# Patient Record
Sex: Female | Born: 1945 | Race: White | Hispanic: No | Smoking: Former smoker
Health system: Southern US, Community
[De-identification: ages and names within clinical notes are randomized; demographics above are authoritative.]

## PROBLEM LIST (undated history)

## (undated) DIAGNOSIS — F32A Depression, unspecified: Secondary | ICD-10-CM

## (undated) DIAGNOSIS — E785 Hyperlipidemia, unspecified: Secondary | ICD-10-CM

## (undated) DIAGNOSIS — I10 Essential (primary) hypertension: Secondary | ICD-10-CM

## (undated) DIAGNOSIS — F329 Major depressive disorder, single episode, unspecified: Secondary | ICD-10-CM

## (undated) DIAGNOSIS — R7303 Prediabetes: Secondary | ICD-10-CM

## (undated) DIAGNOSIS — N281 Cyst of kidney, acquired: Secondary | ICD-10-CM

## (undated) DIAGNOSIS — Z720 Tobacco use: Secondary | ICD-10-CM

## (undated) DIAGNOSIS — I639 Cerebral infarction, unspecified: Secondary | ICD-10-CM

## (undated) DIAGNOSIS — N3946 Mixed incontinence: Secondary | ICD-10-CM

## (undated) DIAGNOSIS — J449 Chronic obstructive pulmonary disease, unspecified: Secondary | ICD-10-CM

## (undated) DIAGNOSIS — E663 Overweight: Secondary | ICD-10-CM

## (undated) DIAGNOSIS — F419 Anxiety disorder, unspecified: Secondary | ICD-10-CM

## (undated) DIAGNOSIS — M1990 Unspecified osteoarthritis, unspecified site: Secondary | ICD-10-CM

## (undated) DIAGNOSIS — M8580 Other specified disorders of bone density and structure, unspecified site: Secondary | ICD-10-CM

## (undated) DIAGNOSIS — I4891 Unspecified atrial fibrillation: Secondary | ICD-10-CM

## (undated) HISTORY — DX: Unspecified osteoarthritis, unspecified site: M19.90

## (undated) HISTORY — DX: Essential (primary) hypertension: I10

## (undated) HISTORY — DX: Mixed incontinence: N39.46

## (undated) HISTORY — DX: Depression, unspecified: F32.A

## (undated) HISTORY — DX: Anxiety disorder, unspecified: F41.9

## (undated) HISTORY — DX: Other specified disorders of bone density and structure, unspecified site: M85.80

## (undated) HISTORY — PX: BREAST EXCISIONAL BIOPSY: SUR124

## (undated) HISTORY — DX: Overweight: E66.3

## (undated) HISTORY — DX: Cyst of kidney, acquired: N28.1

## (undated) HISTORY — DX: Tobacco use: Z72.0

## (undated) HISTORY — DX: Major depressive disorder, single episode, unspecified: F32.9

## (undated) HISTORY — DX: Prediabetes: R73.03

## (undated) HISTORY — DX: Hyperlipidemia, unspecified: E78.5

## (undated) HISTORY — DX: Chronic obstructive pulmonary disease, unspecified: J44.9

---

## 1970-09-24 HISTORY — PX: BREAST EXCISIONAL BIOPSY: SUR124

## 1970-09-24 HISTORY — PX: BREAST SURGERY: SHX581

## 1974-09-24 HISTORY — PX: VAGINAL HYSTERECTOMY: SUR661

## 2000-06-20 ENCOUNTER — Other Ambulatory Visit: Admission: RE | Admit: 2000-06-20 | Discharge: 2000-06-20 | Payer: Self-pay | Admitting: Obstetrics and Gynecology

## 2000-12-26 ENCOUNTER — Encounter: Admission: RE | Admit: 2000-12-26 | Discharge: 2000-12-26 | Payer: Self-pay | Admitting: Family Medicine

## 2000-12-26 ENCOUNTER — Encounter: Payer: Self-pay | Admitting: Family Medicine

## 2002-01-02 ENCOUNTER — Encounter: Payer: Self-pay | Admitting: Family Medicine

## 2002-01-02 ENCOUNTER — Encounter: Admission: RE | Admit: 2002-01-02 | Discharge: 2002-01-02 | Payer: Self-pay | Admitting: Family Medicine

## 2002-12-24 HISTORY — PX: BUNIONECTOMY: SHX129

## 2002-12-25 ENCOUNTER — Other Ambulatory Visit: Admission: RE | Admit: 2002-12-25 | Discharge: 2002-12-25 | Payer: Self-pay | Admitting: Obstetrics and Gynecology

## 2002-12-25 LAB — HM PAP SMEAR

## 2004-03-02 ENCOUNTER — Encounter: Admission: RE | Admit: 2004-03-02 | Discharge: 2004-05-31 | Payer: Self-pay | Admitting: Family Medicine

## 2006-05-03 ENCOUNTER — Encounter: Admission: RE | Admit: 2006-05-03 | Discharge: 2006-05-03 | Payer: Self-pay | Admitting: Obstetrics and Gynecology

## 2006-05-03 LAB — HM DEXA SCAN: HM Dexa Scan: NORMAL

## 2007-05-07 ENCOUNTER — Encounter: Admission: RE | Admit: 2007-05-07 | Discharge: 2007-05-07 | Payer: Self-pay | Admitting: Family Medicine

## 2008-05-17 ENCOUNTER — Encounter: Admission: RE | Admit: 2008-05-17 | Discharge: 2008-05-17 | Payer: Self-pay | Admitting: Family Medicine

## 2008-09-24 DIAGNOSIS — N3946 Mixed incontinence: Secondary | ICD-10-CM

## 2008-09-24 HISTORY — DX: Mixed incontinence: N39.46

## 2009-05-18 ENCOUNTER — Encounter: Admission: RE | Admit: 2009-05-18 | Discharge: 2009-05-18 | Payer: Self-pay | Admitting: Obstetrics and Gynecology

## 2009-05-25 ENCOUNTER — Encounter: Admission: RE | Admit: 2009-05-25 | Discharge: 2009-05-25 | Payer: Self-pay | Admitting: Obstetrics and Gynecology

## 2010-05-26 ENCOUNTER — Encounter: Admission: RE | Admit: 2010-05-26 | Discharge: 2010-05-26 | Payer: Self-pay | Admitting: Obstetrics and Gynecology

## 2011-05-08 ENCOUNTER — Other Ambulatory Visit: Payer: Self-pay | Admitting: Family Medicine

## 2011-05-08 DIAGNOSIS — Z1231 Encounter for screening mammogram for malignant neoplasm of breast: Secondary | ICD-10-CM

## 2011-06-13 ENCOUNTER — Ambulatory Visit
Admission: RE | Admit: 2011-06-13 | Discharge: 2011-06-13 | Disposition: A | Discharge status: Home / Self Care | Payer: BC Managed Care – PPO | Source: Ambulatory Visit | Attending: Family Medicine | Admitting: Family Medicine

## 2011-06-13 DIAGNOSIS — Z1231 Encounter for screening mammogram for malignant neoplasm of breast: Secondary | ICD-10-CM

## 2011-11-12 DIAGNOSIS — H04129 Dry eye syndrome of unspecified lacrimal gland: Secondary | ICD-10-CM | POA: Diagnosis not present

## 2011-11-12 DIAGNOSIS — H2510 Age-related nuclear cataract, unspecified eye: Secondary | ICD-10-CM | POA: Diagnosis not present

## 2011-11-12 DIAGNOSIS — H01009 Unspecified blepharitis unspecified eye, unspecified eyelid: Secondary | ICD-10-CM | POA: Diagnosis not present

## 2011-11-12 DIAGNOSIS — H5034 Intermittent alternating exotropia: Secondary | ICD-10-CM | POA: Diagnosis not present

## 2012-03-11 DIAGNOSIS — R7309 Other abnormal glucose: Secondary | ICD-10-CM | POA: Diagnosis not present

## 2012-03-11 DIAGNOSIS — E7800 Pure hypercholesterolemia, unspecified: Secondary | ICD-10-CM | POA: Diagnosis not present

## 2012-03-11 DIAGNOSIS — F17200 Nicotine dependence, unspecified, uncomplicated: Secondary | ICD-10-CM | POA: Diagnosis not present

## 2012-03-11 DIAGNOSIS — Z23 Encounter for immunization: Secondary | ICD-10-CM | POA: Diagnosis not present

## 2012-05-23 ENCOUNTER — Other Ambulatory Visit: Payer: Self-pay | Admitting: Family Medicine

## 2012-05-23 DIAGNOSIS — Z1231 Encounter for screening mammogram for malignant neoplasm of breast: Secondary | ICD-10-CM

## 2012-06-16 ENCOUNTER — Other Ambulatory Visit: Payer: Self-pay | Admitting: Obstetrics and Gynecology

## 2012-06-16 ENCOUNTER — Ambulatory Visit
Admission: RE | Admit: 2012-06-16 | Discharge: 2012-06-16 | Disposition: A | Discharge status: Home / Self Care | Payer: Medicare Other | Source: Ambulatory Visit | Attending: Family Medicine | Admitting: Family Medicine

## 2012-06-16 DIAGNOSIS — Z1231 Encounter for screening mammogram for malignant neoplasm of breast: Secondary | ICD-10-CM | POA: Diagnosis not present

## 2012-06-16 LAB — HM MAMMOGRAPHY

## 2012-09-19 DIAGNOSIS — E785 Hyperlipidemia, unspecified: Secondary | ICD-10-CM | POA: Diagnosis not present

## 2012-09-19 DIAGNOSIS — E7800 Pure hypercholesterolemia, unspecified: Secondary | ICD-10-CM | POA: Diagnosis not present

## 2012-11-17 DIAGNOSIS — H2510 Age-related nuclear cataract, unspecified eye: Secondary | ICD-10-CM | POA: Diagnosis not present

## 2013-01-06 ENCOUNTER — Encounter: Payer: Self-pay | Admitting: *Deleted

## 2013-01-07 ENCOUNTER — Encounter: Payer: Self-pay | Admitting: Obstetrics and Gynecology

## 2013-01-07 ENCOUNTER — Ambulatory Visit (INDEPENDENT_AMBULATORY_CARE_PROVIDER_SITE_OTHER): Payer: Medicare Other | Admitting: Obstetrics and Gynecology

## 2013-01-07 VITALS — BP 122/80 | Ht 63.5 in | Wt 158.0 lb

## 2013-01-07 DIAGNOSIS — Z124 Encounter for screening for malignant neoplasm of cervix: Secondary | ICD-10-CM

## 2013-01-07 DIAGNOSIS — Z01419 Encounter for gynecological examination (general) (routine) without abnormal findings: Secondary | ICD-10-CM | POA: Diagnosis not present

## 2013-01-07 DIAGNOSIS — N3946 Mixed incontinence: Secondary | ICD-10-CM | POA: Diagnosis not present

## 2013-01-07 MED ORDER — DARIFENACIN HYDROBROMIDE ER 15 MG PO TB24: 15.0000 mg | ORAL_TABLET | Freq: Every day | ORAL | Status: DC

## 2013-01-07 NOTE — Patient Instructions (Addendum)
EXERCISE AND DIET:  We recommended that you start or continue a regular exercise program for good health. Regular exercise means any activity that makes your heart beat faster and makes you sweat.  We recommend exercising at least 30 minutes per day at least 3 days a week, preferably 4 or 5.  We also recommend a diet low in fat and sugar.  Inactivity, poor dietary choices and obesity can cause diabetes, heart attack, stroke, and kidney damage, among others.    ALCOHOL AND SMOKING:  Women should limit their alcohol intake to no more than 7 drinks/beers/glasses of wine (combined, not each!) per week. Moderation of alcohol intake to this level decreases your risk of breast cancer and liver damage. And of course, no recreational drugs are part of a healthy lifestyle.  And absolutely no smoking or even second hand smoke. Most people know smoking can cause heart and lung diseases, but did you know it also contributes to weakening of your bones? Aging of your skin?  Yellowing of your teeth and nails?  CALCIUM AND VITAMIN D:  Adequate intake of calcium and Vitamin D are recommended.  The recommendations for exact amounts of these supplements seem to change often, but generally speaking 600 mg of calcium (either carbonate or citrate) and 800 units of Vitamin D per day seems prudent. Certain women may benefit from higher intake of Vitamin D.  If you are among these women, your doctor will have told you during your visit.    PAP SMEARS:  Pap smears, to check for cervical cancer or precancers,  have traditionally been done yearly, although recent scientific advances have shown that most women can have pap smears less often.  However, every woman still should have a physical exam from her gynecologist every 66 years. It will include a breast check, inspection of the vulva and vagina to check for abnormal growths or skin changes, a visual exam of the cervix, and then an exam to evaluate the size and shape of the uterus and  ovaries.  And after 66 years of age, a rectal exam is indicated to check for rectal cancers. We will also provide age appropriate advice regarding health maintenance, like when you should have certain vaccines, screening for sexually transmitted diseases, bone density testing, colonoscopy, mammograms, etc.   MAMMOGRAMS:  All women over 66 years old should have a yearly mammogram. Many facilities now offer a "3D" mammogram, which may cost around $50 extra out of pocket. If possible,  we recommend you accept the option to have the 3D mammogram performed.  It both reduces the number of women who will be called back for extra views which then turn out to be normal, and it is better than the routine mammogram at detecting truly abnormal areas.    COLONOSCOPY:  Colonoscopy to screen for colon cancer is recommended for all women at age 66.  We know, you hate the idea of the prep.  We agree, BUT, having colon cancer and not knowing it is worse!!  Colon cancer so often starts as a polyp that can be seen and removed at colonscopy, which can quite literally save your life!  And if your first colonoscopy is normal and you have no family history of colon cancer, most women don't have to have it again for 10 years.  Once every ten years, you can do something that may end up saving your life, right?  We will be happy to help you get it scheduled when you are ready.    Be sure to check your insurance coverage so you understand how much it will cost.  It may be covered as a preventative service at no cost, but you should check your particular policy.     Give your new bladder medicine about a month to see how it will work for you.  Call me if you feel it is not helping after that time.  STOP SMOKING!!!  It is making your bones crumble!!

## 2013-01-07 NOTE — Progress Notes (Signed)
67 y.o.  Married  Caucasian female   G2P2 here for annual exam. Also complains that incontinence is worsening, esp SUI but also urge incontinence.  Nocturia times 2-3.  Pt wants to try something.  She does already have dry eye and has to use drops for that.  Prev used Detrol LA and had trouble sleeping.    Encourage pt to quit smoking.  Tried e cig, but still smoking 1 ppd.     Patient's last menstrual period was 09/24/1974.          Sexually active: yes  The current method of family planning is status post hysterectomy and post menopausal status.    Exercising: walking 3 days a week for 30 minutes Last mammogram:  06/16/12 benign Last pap smear:12/25/02 WNL History of abnormal pap: no Smoking: cigarette smoker average 1 pack a day Alcohol: 2-3 glasses of wine a week Last colonoscopy: 06/30/06 normal repeat in 10 years Last Bone Density:  05/12/06 normal Last tetanus shot: not sure Last cholesterol check: 08/2012 elevated  Hgb:  pcp              Urine: pcp    Health Maintenance  Topic Date Due  . Tetanus/tdap  10/21/1965  . Zostavax  10/21/2006  . Pneumococcal Polysaccharide Vaccine Age 3 And Over  10/22/2011  . Influenza Vaccine  05/25/2013  . Mammogram  06/16/2014  . Colonoscopy  06/30/2016    Family History  Problem Relation Age of Onset  . Heart disease Mother   . Dementia Mother   . Diabetes Father   . Heart attack Father     There is no problem list on file for this patient.   Past Medical History  Diagnosis Date  . Mixed incontinence 2010    Past Surgical History  Procedure Laterality Date  . Bunionectomy  12/2002  . Vaginal hysterectomy  1976  . Breast surgery  1972    benign tumor removed rt breast    Allergies: Lamisil  Current Outpatient Prescriptions  Medication Sig Dispense Refill  . aspirin 81 MG tablet Take 81 mg by mouth daily.      . Calcium Carbonate-Vitamin D (CALCIUM 600/VITAMIN D PO) Take 2 tablets by mouth daily.      . Multiple  Vitamins-Minerals (CENTRUM SILVER PO) Take by mouth daily.      . rosuvastatin (CRESTOR) 40 MG tablet Take 40 mg by mouth daily.      . vitamin E (VITAMIN E) 400 UNIT capsule Take 400 Units by mouth daily.       No current facility-administered medications for this visit.    ROS: Pertinent items are noted in HPI.  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  Husband with COPD from smoking.  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    Exam:    BP 122/80  Ht 5' 3.5" (1.613 m)  Wt 158 lb (71.668 kg)  BMI 27.55 kg/m2  LMP 09/24/1974  Down one pound from last year Wt Readings from Last 3 Encounters:  01/07/13 158 lb (71.668 kg)     Ht Readings from Last 3 Encounters:  01/07/13 5' 3.5" (1.613 m)  height stable from last year  General appearance: alert, cooperative and appears stated age Head: Normocephalic, without obvious abnormality, atraumatic Neck: no adenopathy, supple, symmetrical, trachea midline and thyroid not enlarged, symmetric, no  tenderness/mass/nodules Lungs: clear to auscultation bilaterally  Breasts: Inspection negative, No nipple retraction or dimpling, No nipple discharge or bleeding, No axillary or supraclavicular adenopathy, Normal to palpation without dominant masses Heart: regular rate and rhythm Abdomen: soft, non-tender; bowel sounds normal; no masses,  no organomegaly Extremities: extremities normal, atraumatic, no cyanosis or edema Skin: Skin color, texture, turgor normal. No rashes or lesions Lymph nodes: Cervical, supraclavicular, and axillary nodes normal. No abnormal inguinal nodes palpated Neurologic: Grossly normal   Pelvic: External genitalia:  no lesions              Urethra:  normal appearing urethra with no masses, tenderness or lesions              Bartholins and Skenes: normal                 Vagina: normal  appearing vagina with normal color and discharge, no lesions, grade 2 cystocele              Cervix: surgically absent              Pap taken: no        Bimanual Exam:  No pelvic masses or nodularity.  Adnexa: normal adnexa in size, nontender and no masses                                      Rectovaginal: Confirms                                      Anus:  normal sphincter tone, no lesions  A: normal menopausal exam, no HRT; quit in Sept 2011 to decrease the # of meds she took.       S/p TVH for DUB in 1976     Grade 2 cystocele/urethrocele with mixed incontinence.  Prev used Detrol LA but "couldn't sleep" Ready now to try something else since problem is worsening.      P: mammogram counseled on breast self exam, mammography screening, adequate intake of calcium and vitamin D, diet and exercise return annually or prn Instructed in enablex 15 mg po qd #30. 12 rf.  Get BMD this year with next MM in the fall. Discussed smoking's effect on BMD.      An After Visit Summary was printed and given to the patient.

## 2013-01-12 ENCOUNTER — Telehealth: Payer: Self-pay | Admitting: *Deleted

## 2013-01-12 MED ORDER — SOLIFENACIN SUCCINATE 5 MG PO TABS: 5.0000 mg | ORAL_TABLET | Freq: Every day | ORAL | Status: DC

## 2013-01-12 NOTE — Telephone Encounter (Signed)
Please let the patient know that we will need to select a medication which is on the preferred drug list.  If she has not tried Vesicare, I recommend this option. Rx Vesicare 5 mg daily.  Dispense #30.  Refill 2  Needs recheck with Dr. Tresa Res in 6 weeks to see response.  Thanks,  [employer]

## 2013-01-12 NOTE — Telephone Encounter (Signed)
Please advise on medication for Dr. Tresa Res patient. Chart on your desk. Will [facility].

## 2013-01-12 NOTE — Telephone Encounter (Signed)
Patient notifed of medication change to Vesicare. [facility] to [facility]. Patient aware need to make 6 week appt. With Dr. Tresa Res for follow up. sue

## 2013-01-22 ENCOUNTER — Telehealth: Payer: Self-pay | Admitting: *Deleted

## 2013-01-22 NOTE — Telephone Encounter (Signed)
SEE CHART IN YOUR CABINET FOR PRIOR AUTH. FORM TO FILLED OUT FOR MEDICATION ENABLEX.

## 2013-01-23 ENCOUNTER — Telehealth: Payer: Self-pay | Admitting: *Deleted

## 2013-01-23 NOTE — Telephone Encounter (Signed)
Faxed prior authorization form to [facility] for Enablex, sue

## 2013-01-26 ENCOUNTER — Other Ambulatory Visit: Payer: Self-pay | Admitting: *Deleted

## 2013-01-26 NOTE — Telephone Encounter (Signed)
Prior authorization approved for Enablex.

## 2013-02-02 ENCOUNTER — Telehealth: Payer: Self-pay | Admitting: Obstetrics and Gynecology

## 2013-02-02 MED ORDER — SOLIFENACIN SUCCINATE 5 MG PO TABS: 5.0000 mg | ORAL_TABLET | Freq: Every day | ORAL | Status: DC

## 2013-02-02 NOTE — Telephone Encounter (Signed)
Pt states Vesicare is working well.  She is not having any side effects.  Pt was changed from [facility] on 4/21 due to insurance.  Pt would like to have 90 day supply sent to mail order.  Pt's last AEX was 01/07/13.  Is this refill OK or does the pt need an OV with you first?  Chart on your desk.  Please advise.

## 2013-02-02 NOTE — Telephone Encounter (Signed)
Enablex not covered by insurance. Vesicare rx was given with two refills and reports it is "working great". Requests rx for mail order be sent [facility] at [phone].

## 2013-02-02 NOTE — Telephone Encounter (Signed)
Verbal per Dr. Tresa Res, OK to fill 90 day supply through 01/07/14.  RX sent.  No answer at home number x 8 rings.

## 2013-03-11 DIAGNOSIS — E7800 Pure hypercholesterolemia, unspecified: Secondary | ICD-10-CM | POA: Diagnosis not present

## 2013-03-11 DIAGNOSIS — F17200 Nicotine dependence, unspecified, uncomplicated: Secondary | ICD-10-CM | POA: Diagnosis not present

## 2013-03-11 DIAGNOSIS — R7301 Impaired fasting glucose: Secondary | ICD-10-CM | POA: Diagnosis not present

## 2013-03-11 DIAGNOSIS — I10 Essential (primary) hypertension: Secondary | ICD-10-CM | POA: Diagnosis not present

## 2013-03-11 DIAGNOSIS — Z1331 Encounter for screening for depression: Secondary | ICD-10-CM | POA: Diagnosis not present

## 2013-04-22 ENCOUNTER — Telehealth: Payer: Self-pay | Admitting: Obstetrics and Gynecology

## 2013-04-22 NOTE — Telephone Encounter (Signed)
[facility],   Patient is asking for a new presciption for Vesicare. Patient says she was given this prescription from the on call doctor while Dr.Romine was on vacation.

## 2013-04-23 NOTE — Telephone Encounter (Signed)
Left a voice message telling patient that a 90 day supply with refills thru 01/07/14 for Vesicare was Send to [facility] 02/02/13. Please call [facility] to check on refills.

## 2013-04-28 ENCOUNTER — Telehealth: Payer: Self-pay | Admitting: *Deleted

## 2013-04-28 DIAGNOSIS — N3946 Mixed incontinence: Secondary | ICD-10-CM

## 2013-04-28 MED ORDER — DARIFENACIN HYDROBROMIDE ER 15 MG PO TB24: 15.0000 mg | ORAL_TABLET | Freq: Every day | ORAL | Status: DC

## 2013-04-28 NOTE — Telephone Encounter (Signed)
Fax from [facility] for new prescription request of enablex. Last Aex 01/07/13; #30/12 was sent to patient's local [facility] [address].  Enablex #90/2 rf's sent to [facility] to last patient until AEX.

## 2013-05-27 ENCOUNTER — Telehealth: Payer: Self-pay | Admitting: Gynecology

## 2013-05-27 NOTE — Telephone Encounter (Incomplete)
Patient needs to have a order sent to [facility] for her mammogram and her bone density.

## 2013-05-27 NOTE — Telephone Encounter (Signed)
Spoke with pt to advise Dr. Farrel Gobble will review chart and [facility]. Advised pt it would be best to wait until at least 06-16-13 for MMG to make sure it has been a year since her last one. Pt will call in a few days for appt.

## 2013-05-27 NOTE — Telephone Encounter (Signed)
Per paper chart, last MMG 06-16-12, normal. Last BMD 04-2006, normal. Both done at [facility]. Paper chart on your desk.

## 2013-06-01 ENCOUNTER — Telehealth: Payer: Self-pay | Admitting: *Deleted

## 2013-06-01 ENCOUNTER — Other Ambulatory Visit: Payer: Self-pay | Admitting: Gynecology

## 2013-06-01 NOTE — Telephone Encounter (Signed)
Spoke with patient explained to her that Medicare will not cover Bone Density(screening) the last BMD she had was in 2007 it was normal, we have tried every Cpt code we could think of  unless you have a estrogen deficiency, at risk for osteroporosis and a couple others which you don't have. This is not going to be a covered service. Pt was ok with not having a BMD test done, will just have her mammogram for this year.

## 2013-06-02 ENCOUNTER — Other Ambulatory Visit: Payer: Self-pay

## 2013-06-02 DIAGNOSIS — Z1231 Encounter for screening mammogram for malignant neoplasm of breast: Secondary | ICD-10-CM

## 2013-06-16 DIAGNOSIS — Z23 Encounter for immunization: Secondary | ICD-10-CM | POA: Diagnosis not present

## 2013-06-23 ENCOUNTER — Ambulatory Visit
Admission: RE | Admit: 2013-06-23 | Discharge: 2013-06-23 | Disposition: A | Discharge status: Home / Self Care | Payer: Medicare Other | Source: Ambulatory Visit

## 2013-06-23 DIAGNOSIS — Z1231 Encounter for screening mammogram for malignant neoplasm of breast: Secondary | ICD-10-CM

## 2013-08-24 ENCOUNTER — Ambulatory Visit
Admission: RE | Admit: 2013-08-24 | Discharge: 2013-08-24 | Disposition: A | Discharge status: Home / Self Care | Payer: Medicare Other | Source: Ambulatory Visit | Attending: Family Medicine | Admitting: Family Medicine

## 2013-08-24 ENCOUNTER — Other Ambulatory Visit: Payer: Self-pay | Admitting: Family Medicine

## 2013-08-24 DIAGNOSIS — M79644 Pain in right finger(s): Secondary | ICD-10-CM

## 2013-08-24 DIAGNOSIS — F17200 Nicotine dependence, unspecified, uncomplicated: Secondary | ICD-10-CM | POA: Diagnosis not present

## 2013-08-24 DIAGNOSIS — R079 Chest pain, unspecified: Secondary | ICD-10-CM

## 2013-08-24 DIAGNOSIS — S63259A Unspecified dislocation of unspecified finger, initial encounter: Secondary | ICD-10-CM | POA: Diagnosis not present

## 2013-08-24 DIAGNOSIS — I10 Essential (primary) hypertension: Secondary | ICD-10-CM | POA: Diagnosis not present

## 2013-08-24 DIAGNOSIS — M79609 Pain in unspecified limb: Secondary | ICD-10-CM | POA: Diagnosis not present

## 2013-08-24 DIAGNOSIS — M79645 Pain in left finger(s): Secondary | ICD-10-CM

## 2013-09-08 DIAGNOSIS — M6281 Muscle weakness (generalized): Secondary | ICD-10-CM | POA: Diagnosis not present

## 2013-09-08 DIAGNOSIS — M79609 Pain in unspecified limb: Secondary | ICD-10-CM | POA: Diagnosis not present

## 2013-09-10 DIAGNOSIS — M6281 Muscle weakness (generalized): Secondary | ICD-10-CM | POA: Diagnosis not present

## 2013-09-10 DIAGNOSIS — M79609 Pain in unspecified limb: Secondary | ICD-10-CM | POA: Diagnosis not present

## 2013-09-30 DIAGNOSIS — M19049 Primary osteoarthritis, unspecified hand: Secondary | ICD-10-CM | POA: Diagnosis not present

## 2013-10-23 DIAGNOSIS — M19049 Primary osteoarthritis, unspecified hand: Secondary | ICD-10-CM | POA: Diagnosis not present

## 2013-11-04 DIAGNOSIS — M19049 Primary osteoarthritis, unspecified hand: Secondary | ICD-10-CM | POA: Diagnosis not present

## 2013-11-04 DIAGNOSIS — G5600 Carpal tunnel syndrome, unspecified upper limb: Secondary | ICD-10-CM | POA: Diagnosis not present

## 2013-12-15 ENCOUNTER — Encounter: Payer: Self-pay | Admitting: Gynecology

## 2014-01-06 ENCOUNTER — Ambulatory Visit: Payer: Medicare Other | Admitting: Gynecology

## 2014-01-11 ENCOUNTER — Ambulatory Visit: Payer: Medicare Other | Admitting: Obstetrics and Gynecology

## 2014-01-11 ENCOUNTER — Ambulatory Visit: Payer: Medicare Other | Admitting: Gynecology

## 2014-01-11 DIAGNOSIS — G5600 Carpal tunnel syndrome, unspecified upper limb: Secondary | ICD-10-CM | POA: Diagnosis not present

## 2014-01-12 DIAGNOSIS — H2510 Age-related nuclear cataract, unspecified eye: Secondary | ICD-10-CM | POA: Diagnosis not present

## 2014-01-12 DIAGNOSIS — H01019 Ulcerative blepharitis unspecified eye, unspecified eyelid: Secondary | ICD-10-CM | POA: Diagnosis not present

## 2014-01-13 ENCOUNTER — Encounter: Payer: Self-pay | Admitting: Gynecology

## 2014-01-13 ENCOUNTER — Ambulatory Visit (INDEPENDENT_AMBULATORY_CARE_PROVIDER_SITE_OTHER): Payer: Medicare Other | Admitting: Gynecology

## 2014-01-13 DIAGNOSIS — M40204 Unspecified kyphosis, thoracic region: Secondary | ICD-10-CM | POA: Insufficient documentation

## 2014-01-13 DIAGNOSIS — Z124 Encounter for screening for malignant neoplasm of cervix: Secondary | ICD-10-CM

## 2014-01-13 DIAGNOSIS — F17200 Nicotine dependence, unspecified, uncomplicated: Secondary | ICD-10-CM | POA: Insufficient documentation

## 2014-01-13 DIAGNOSIS — Z01419 Encounter for gynecological examination (general) (routine) without abnormal findings: Secondary | ICD-10-CM | POA: Diagnosis not present

## 2014-01-13 DIAGNOSIS — E785 Hyperlipidemia, unspecified: Secondary | ICD-10-CM | POA: Insufficient documentation

## 2014-01-13 DIAGNOSIS — M4000 Postural kyphosis, site unspecified: Secondary | ICD-10-CM | POA: Diagnosis not present

## 2014-01-13 DIAGNOSIS — N3946 Mixed incontinence: Secondary | ICD-10-CM

## 2014-01-13 DIAGNOSIS — E2839 Other primary ovarian failure: Secondary | ICD-10-CM

## 2014-01-13 DIAGNOSIS — M1990 Unspecified osteoarthritis, unspecified site: Secondary | ICD-10-CM | POA: Insufficient documentation

## 2014-01-13 MED ORDER — SOLIFENACIN SUCCINATE 5 MG PO TABS: 5.0000 mg | ORAL_TABLET | Freq: Every day | ORAL | Status: DC

## 2014-01-13 NOTE — Progress Notes (Signed)
68 y.o. married Caucasian female   G2P2 here for annual exam. Pt reports menses are not regular, secondary TVH  She does not report hot flashes, does not have night sweats, does not have vaginal dryness.  She is not using lubricants.  She does not report post-menopasual bleeding.  Pt is now using the vesicare due to lower cost, she is not having any issues with dry eye. Pt did not get DEXA last year.  Pt is off estrogen for 5y.  Pt is still smoking 1ppd for 40y, is not interested in quitting.  Pt reports grandson died September 17, 2103 in car accident at 67yo, they will be celebrating his [place], family is adjusting slowly.    Patient's last menstrual period was 09/24/1974.          Sexually active: yes  The current method of family planning is status post hysterectomy.    Exercising: yes  walking a couple of times per week Last pap: 12/25/02-WNL Abnormal PAP: none Mammogram: 06/23/13-normal BSE: yes Colonoscopy: 2008-repeat in 10 years DEXA: 8/07-normal-will need order Alcohol: 3-4 glasses of wine/week Tobacco: one PPD  Health Maintenance  Topic Date Due  . Tetanus/tdap  10/21/1965  . Zostavax  10/21/2006  . Pneumococcal Polysaccharide Vaccine Age 98 And Over  10/22/2011  . Influenza Vaccine  04/24/2014  . Mammogram  06/24/2015  . Colonoscopy  06/30/2016    Family History  Problem Relation Age of Onset  . Heart disease Mother   . Dementia Mother   . Diabetes Father   . Heart attack Father     There are no active problems to display for this patient.   Past Medical History  Diagnosis Date  . Mixed incontinence 2010    Past Surgical History  Procedure Laterality Date  . Bunionectomy  12/2002  . Vaginal hysterectomy  1976  . Breast surgery  1972    benign tumor removed rt breast    Allergies: Lamisil  Current Outpatient Prescriptions  Medication Sig Dispense Refill  . aspirin 81 MG tablet Take 81 mg by mouth daily.      . Calcium Carbonate-Vitamin D  (CALCIUM 600/VITAMIN D PO) Take 2 tablets by mouth daily.      Marland Kitchen darifenacin (ENABLEX) 15 MG 24 hr tablet Take 1 tablet (15 mg total) by mouth daily.  90 tablet  2  . Multiple Vitamins-Minerals (CENTRUM SILVER PO) Take by mouth daily.      . rosuvastatin (CRESTOR) 40 MG tablet Take 40 mg by mouth daily.      . solifenacin (VESICARE) 5 MG tablet Take 1 tablet (5 mg total) by mouth daily.  90 tablet  3  . vitamin E (VITAMIN E) 400 UNIT capsule Take 400 Units by mouth daily.       No current facility-administered medications for this visit.    ROS: Pertinent items are noted in HPI.  Exam:    LMP 09/24/1974 Weight change: @WEIGHTCHANGE @ Last 3 height recordings:  Ht Readings from Last 3 Encounters:  01/07/13 5' 3.5" (1.613 m)   General appearance: alert, cooperative and appears stated age Head: Normocephalic, without obvious abnormality, atraumatic Neck: no adenopathy, no carotid bruit, no JVD, supple, symmetrical, trachea midline and thyroid not enlarged, symmetric, no tenderness/mass/nodules Lungs: clear to auscultation bilaterally Breasts: normal appearance, no masses or tenderness Heart: regular rate and rhythm, S1, S2 normal, no murmur, click, rub or gallop Abdomen: soft, non-tender; bowel sounds normal; no masses,  no organomegaly Extremities: extremities normal, atraumatic,  no cyanosis or edema Skin: Skin color, texture, turgor normal. No rashes or lesions Lymph nodes: Cervical, supraclavicular, and axillary nodes normal. no inguinal nodes palpated Neurologic: Grossly normal   Pelvic: External genitalia:  no lesions              Urethra: normal appearing urethra with no masses, tenderness or lesions              Bartholins and Skenes: Bartholin's, Urethra, Skene's normal                 Vagina: atrophic, supported, cystocele, rectocele              Cervix: absent              Pap taken: no        Bimanual Exam:  Uterus:  absent                                      Adnexa:     no masses                                      Rectovaginal: Confirms                                      Anus:  normal sphincter tone, no lesions  1. Encounter for routine gynecological examination  well woman Discussed PAP guideline changes, importance of weight bearing exercises, calcium, vit D and balanced diet.  2. Mixed incontinence urge and stress Tolerating veiscare will refill - solifenacin (VESICARE) 5 MG tablet; Take 1 tablet (5 mg total) by mouth daily.  Dispense: 90 tablet; Refill: 3  3. Smoker Not interested in quitting, pt aware of affects on BMD, general health - DG Bone Density; Future  4. Kyphosis of thoracic region  - DG Bone Density; Future  5. Hypoestrogenism  - DG Bone Density; Future  Condolences given regarding sudden death of grandson, pt with good social support   An After Visit Summary was printed and given to the patient.

## 2014-01-20 ENCOUNTER — Ambulatory Visit
Admission: RE | Admit: 2014-01-20 | Discharge: 2014-01-20 | Disposition: A | Discharge status: Home / Self Care | Payer: Medicare Other | Source: Ambulatory Visit | Attending: Gynecology | Admitting: Gynecology

## 2014-01-20 DIAGNOSIS — M40204 Unspecified kyphosis, thoracic region: Secondary | ICD-10-CM

## 2014-01-20 DIAGNOSIS — F17200 Nicotine dependence, unspecified, uncomplicated: Secondary | ICD-10-CM

## 2014-01-20 DIAGNOSIS — Z780 Asymptomatic menopausal state: Secondary | ICD-10-CM | POA: Diagnosis not present

## 2014-01-20 DIAGNOSIS — E2839 Other primary ovarian failure: Secondary | ICD-10-CM

## 2014-02-23 DIAGNOSIS — H01019 Ulcerative blepharitis unspecified eye, unspecified eyelid: Secondary | ICD-10-CM | POA: Diagnosis not present

## 2014-06-03 ENCOUNTER — Other Ambulatory Visit: Payer: Self-pay

## 2014-06-03 DIAGNOSIS — Z1231 Encounter for screening mammogram for malignant neoplasm of breast: Secondary | ICD-10-CM

## 2014-06-29 ENCOUNTER — Ambulatory Visit
Admission: RE | Admit: 2014-06-29 | Discharge: 2014-06-29 | Disposition: A | Discharge status: Home / Self Care | Payer: Medicare Other | Source: Ambulatory Visit

## 2014-06-29 DIAGNOSIS — Z1231 Encounter for screening mammogram for malignant neoplasm of breast: Secondary | ICD-10-CM

## 2014-07-26 ENCOUNTER — Encounter: Payer: Self-pay | Admitting: Gynecology

## 2014-08-04 DIAGNOSIS — E780 Pure hypercholesterolemia: Secondary | ICD-10-CM | POA: Diagnosis not present

## 2014-08-04 DIAGNOSIS — R030 Elevated blood-pressure reading, without diagnosis of hypertension: Secondary | ICD-10-CM | POA: Diagnosis not present

## 2014-08-04 DIAGNOSIS — F17210 Nicotine dependence, cigarettes, uncomplicated: Secondary | ICD-10-CM | POA: Diagnosis not present

## 2014-08-04 DIAGNOSIS — Z23 Encounter for immunization: Secondary | ICD-10-CM | POA: Diagnosis not present

## 2014-08-04 DIAGNOSIS — F17200 Nicotine dependence, unspecified, uncomplicated: Secondary | ICD-10-CM | POA: Diagnosis not present

## 2014-08-04 DIAGNOSIS — R739 Hyperglycemia, unspecified: Secondary | ICD-10-CM | POA: Diagnosis not present

## 2014-08-09 ENCOUNTER — Other Ambulatory Visit: Payer: Self-pay | Admitting: Family Medicine

## 2014-08-09 DIAGNOSIS — F17290 Nicotine dependence, other tobacco product, uncomplicated: Secondary | ICD-10-CM

## 2014-08-13 ENCOUNTER — Ambulatory Visit
Admission: RE | Admit: 2014-08-13 | Discharge: 2014-08-13 | Disposition: A | Discharge status: Home / Self Care | Payer: Medicare Other | Source: Ambulatory Visit | Attending: Family Medicine | Admitting: Family Medicine

## 2014-08-13 DIAGNOSIS — J432 Centrilobular emphysema: Secondary | ICD-10-CM | POA: Diagnosis not present

## 2014-08-13 DIAGNOSIS — J9809 Other diseases of bronchus, not elsewhere classified: Secondary | ICD-10-CM | POA: Diagnosis not present

## 2014-08-13 DIAGNOSIS — I2510 Atherosclerotic heart disease of native coronary artery without angina pectoris: Secondary | ICD-10-CM | POA: Diagnosis not present

## 2014-08-13 DIAGNOSIS — F17210 Nicotine dependence, cigarettes, uncomplicated: Secondary | ICD-10-CM | POA: Diagnosis not present

## 2014-08-13 DIAGNOSIS — F17290 Nicotine dependence, other tobacco product, uncomplicated: Secondary | ICD-10-CM

## 2014-08-13 DIAGNOSIS — Z122 Encounter for screening for malignant neoplasm of respiratory organs: Secondary | ICD-10-CM | POA: Diagnosis not present

## 2014-08-13 DIAGNOSIS — Z87891 Personal history of nicotine dependence: Secondary | ICD-10-CM | POA: Diagnosis not present

## 2014-08-24 ENCOUNTER — Telehealth: Payer: Self-pay

## 2014-08-24 NOTE — Telephone Encounter (Signed)
AEX has been rescheduled with Melvia Heaps on 01/17/15

## 2014-08-26 ENCOUNTER — Other Ambulatory Visit: Payer: Self-pay | Admitting: Family Medicine

## 2014-08-26 DIAGNOSIS — H04123 Dry eye syndrome of bilateral lacrimal glands: Secondary | ICD-10-CM | POA: Diagnosis not present

## 2014-08-26 DIAGNOSIS — H01024 Squamous blepharitis left upper eyelid: Secondary | ICD-10-CM | POA: Diagnosis not present

## 2014-08-26 DIAGNOSIS — H01021 Squamous blepharitis right upper eyelid: Secondary | ICD-10-CM | POA: Diagnosis not present

## 2014-08-26 DIAGNOSIS — N281 Cyst of kidney, acquired: Secondary | ICD-10-CM

## 2014-08-26 DIAGNOSIS — H16223 Keratoconjunctivitis sicca, not specified as Sjogren's, bilateral: Secondary | ICD-10-CM | POA: Diagnosis not present

## 2014-08-30 ENCOUNTER — Other Ambulatory Visit: Payer: Medicare Other

## 2014-08-31 ENCOUNTER — Ambulatory Visit
Admission: RE | Admit: 2014-08-31 | Discharge: 2014-08-31 | Disposition: A | Discharge status: Home / Self Care | Payer: Medicare Other | Source: Ambulatory Visit | Attending: Family Medicine | Admitting: Family Medicine

## 2014-08-31 DIAGNOSIS — N281 Cyst of kidney, acquired: Secondary | ICD-10-CM

## 2014-10-26 DIAGNOSIS — H16223 Keratoconjunctivitis sicca, not specified as Sjogren's, bilateral: Secondary | ICD-10-CM | POA: Diagnosis not present

## 2014-10-26 DIAGNOSIS — H01024 Squamous blepharitis left upper eyelid: Secondary | ICD-10-CM | POA: Diagnosis not present

## 2014-10-26 DIAGNOSIS — H01021 Squamous blepharitis right upper eyelid: Secondary | ICD-10-CM | POA: Diagnosis not present

## 2015-01-12 DIAGNOSIS — Z1211 Encounter for screening for malignant neoplasm of colon: Secondary | ICD-10-CM | POA: Diagnosis not present

## 2015-01-17 ENCOUNTER — Ambulatory Visit (INDEPENDENT_AMBULATORY_CARE_PROVIDER_SITE_OTHER): Payer: Medicare Other | Admitting: Certified Nurse Midwife

## 2015-01-17 ENCOUNTER — Ambulatory Visit: Payer: Medicare Other | Admitting: Gynecology

## 2015-01-17 ENCOUNTER — Encounter: Payer: Self-pay | Admitting: Certified Nurse Midwife

## 2015-01-17 VITALS — BP 120/78 | HR 72 | Resp 16 | Ht 63.5 in | Wt 159.0 lb

## 2015-01-17 DIAGNOSIS — N3946 Mixed incontinence: Secondary | ICD-10-CM | POA: Diagnosis not present

## 2015-01-17 DIAGNOSIS — Z01419 Encounter for gynecological examination (general) (routine) without abnormal findings: Secondary | ICD-10-CM | POA: Diagnosis not present

## 2015-01-17 DIAGNOSIS — H01021 Squamous blepharitis right upper eyelid: Secondary | ICD-10-CM | POA: Diagnosis not present

## 2015-01-17 DIAGNOSIS — H01024 Squamous blepharitis left upper eyelid: Secondary | ICD-10-CM | POA: Diagnosis not present

## 2015-01-17 DIAGNOSIS — H2513 Age-related nuclear cataract, bilateral: Secondary | ICD-10-CM | POA: Diagnosis not present

## 2015-01-17 DIAGNOSIS — Z124 Encounter for screening for malignant neoplasm of cervix: Secondary | ICD-10-CM | POA: Diagnosis not present

## 2015-01-17 DIAGNOSIS — H16223 Keratoconjunctivitis sicca, not specified as Sjogren's, bilateral: Secondary | ICD-10-CM | POA: Diagnosis not present

## 2015-01-17 MED ORDER — SOLIFENACIN SUCCINATE 5 MG PO TABS: 5.0000 mg | ORAL_TABLET | Freq: Every day | ORAL | Status: DC

## 2015-01-17 NOTE — Patient Instructions (Signed)

## 2015-01-17 NOTE — Progress Notes (Signed)
68 y.o. G2P2 married  Caucasian Fe here for annual exam. Menopausal, no HRT. Denies vaginal bleeding or vaginal dryness. See PCP for cholesterol management/labs and aex. Patient has been on Vesicare with no leaking of urine since Dr. Joan Flores started her on it. Desires continuance. No hypertensive issues. 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 No health concerns today.   Patient's last menstrual period was 09/24/1974.          Sexually active: Yes.    The current method of family planning is status post hysterectomy.    Exercising: Yes.    some walking Smoker:  no  Health Maintenance: Pap: 12-25-02 neg MMG:  06-29-14 category b density,birads 1:neg Colonoscopy:  2008 f/u24yrs BMD:   01-20-14 normal TDaP:  2015 Labs: pcp Self breast exam: done monthly   reports that she has been smoking Cigarettes.  She has a 40 pack-year smoking history. She has never used smokeless tobacco. She reports that she drinks about 1.2 - 1.8 oz of alcohol per week. She reports that she does not use illicit drugs.  Past Medical History  Diagnosis Date  . Mixed incontinence 2010  . Hyperlipidemia   . Arthritis     Past Surgical History  Procedure Laterality Date  . Bunionectomy  12/2002  . Vaginal hysterectomy  1976  . Breast surgery  1972    benign tumor removed rt breast    Current Outpatient Prescriptions  Medication Sig Dispense Refill  . aspirin 81 MG tablet Take 81 mg by mouth daily.    . Calcium Carbonate-Vitamin D (CALCIUM 600/VITAMIN D PO) Take 2 tablets by mouth daily.    . cetirizine (ZYRTEC) 10 MG tablet Take 10 mg by mouth as needed for allergies.    . Multiple Vitamins-Minerals (CENTRUM SILVER PO) Take by mouth daily.    . RESTASIS 0.05 % ophthalmic emulsion     . rosuvastatin (CRESTOR) 40 MG tablet Take 40 mg by mouth daily.    . solifenacin (VESICARE) 5 MG tablet Take 1 tablet (5 mg total) by mouth daily. 90 tablet 4  . vitamin E (VITAMIN E) 400 UNIT capsule  Take 400 Units by mouth daily.     No current facility-administered medications for this visit.    Family History  Problem Relation Age of Onset  . Heart disease Mother   . Dementia Mother   . Diabetes Father   . Heart attack Father   . Osteoporosis Mother     ROS:  Pertinent items are noted in HPI.  Otherwise, a comprehensive ROS was negative.  Exam:   BP 120/78 mmHg  Pulse 72  Resp 16  Ht 5' 3.5" (1.613 m)  Wt 159 lb (72.122 kg)  BMI 27.72 kg/m2  LMP 09/24/1974 Height: 5' 3.5" (161.3 cm) Ht Readings from Last 3 Encounters:  01/17/15 5' 3.5" (1.613 m)  01/07/13 5' 3.5" (1.613 m)    General appearance: alert, cooperative and appears stated age Head: Normocephalic, without obvious abnormality, atraumatic Neck: no adenopathy, supple, symmetrical, trachea midline and thyroid normal to inspection and palpation Lungs: clear to auscultation bilaterally Breasts: normal appearance, no masses or tenderness, No nipple retraction or dimpling, No nipple discharge or bleeding, No axillary or supraclavicular adenopathy Heart: regular rate and rhythm Abdomen: soft, non-tender; no masses,  no organomegaly Extremities: extremities normal, atraumatic, no cyanosis or edema Skin: Skin color, texture, turgor normal. No rashes or lesions Lymph nodes: Cervical, supraclavicular, and axillary nodes normal. No abnormal inguinal nodes palpated  Neurologic: Grossly normal   Pelvic: External genitalia:  no lesions              Urethra:  normal appearing urethra with no masses, tenderness or lesions              Bartholin's and Skene's: normal                 Vagina: normal appearing vagina with normal color and discharge, no lesions              Cervix: absent              Pap taken: No. Bimanual Exam:  Uterus:  uterus absent              Adnexa: normal adnexa and no mass, fullness, tenderness               Rectovaginal: Confirms               Anus:  normal sphincter tone, no  lesions  Chaperone present: Yes  A:  Well Woman with normal exam  Menopausal no HRT  Urinary stress incontinence with good results with Vesicare use  Cholesterol/arthritis management with PCP   P:   Reviewed health and wellness pertinent to exam  Aware of need to evaluate if vaginal bleeding  Rx Vesicare see order  Continue MD follow up as indicated  Pap smear not taken today   counseled on breast self exam, mammography screening, adequate intake of calcium and vitamin D, diet and exercise, Kegel's exercises  return annually or prn  An After Visit Summary was printed and given to the patient.

## 2015-01-18 NOTE — Progress Notes (Signed)
Reviewed personally.  M. Suzanne Avier Jech, MD.  

## 2015-02-03 DIAGNOSIS — R7309 Other abnormal glucose: Secondary | ICD-10-CM | POA: Diagnosis not present

## 2015-03-21 ENCOUNTER — Other Ambulatory Visit: Payer: Self-pay

## 2015-06-16 ENCOUNTER — Other Ambulatory Visit: Payer: Self-pay

## 2015-06-16 DIAGNOSIS — Z1231 Encounter for screening mammogram for malignant neoplasm of breast: Secondary | ICD-10-CM

## 2015-07-01 ENCOUNTER — Ambulatory Visit: Payer: Medicare Other

## 2015-07-01 DIAGNOSIS — Z23 Encounter for immunization: Secondary | ICD-10-CM | POA: Diagnosis not present

## 2015-07-06 ENCOUNTER — Ambulatory Visit
Admission: RE | Admit: 2015-07-06 | Discharge: 2015-07-06 | Disposition: A | Discharge status: Home / Self Care | Payer: Medicare Other | Source: Ambulatory Visit

## 2015-07-06 DIAGNOSIS — Z1231 Encounter for screening mammogram for malignant neoplasm of breast: Secondary | ICD-10-CM

## 2015-08-09 DIAGNOSIS — R030 Elevated blood-pressure reading, without diagnosis of hypertension: Secondary | ICD-10-CM | POA: Diagnosis not present

## 2015-08-09 DIAGNOSIS — Z79899 Other long term (current) drug therapy: Secondary | ICD-10-CM | POA: Diagnosis not present

## 2015-08-09 DIAGNOSIS — R300 Dysuria: Secondary | ICD-10-CM | POA: Diagnosis not present

## 2015-08-09 DIAGNOSIS — R7301 Impaired fasting glucose: Secondary | ICD-10-CM | POA: Diagnosis not present

## 2015-08-09 DIAGNOSIS — R7303 Prediabetes: Secondary | ICD-10-CM | POA: Diagnosis not present

## 2015-08-09 DIAGNOSIS — R918 Other nonspecific abnormal finding of lung field: Secondary | ICD-10-CM | POA: Diagnosis not present

## 2015-08-09 DIAGNOSIS — Z0000 Encounter for general adult medical examination without abnormal findings: Secondary | ICD-10-CM | POA: Diagnosis not present

## 2015-08-09 DIAGNOSIS — E7800 Pure hypercholesterolemia, unspecified: Secondary | ICD-10-CM | POA: Diagnosis not present

## 2015-08-22 ENCOUNTER — Other Ambulatory Visit: Payer: Self-pay | Admitting: Acute Care

## 2015-08-22 DIAGNOSIS — F17210 Nicotine dependence, cigarettes, uncomplicated: Principal | ICD-10-CM

## 2015-08-24 ENCOUNTER — Encounter: Payer: Self-pay | Admitting: Acute Care

## 2015-08-24 ENCOUNTER — Ambulatory Visit (INDEPENDENT_AMBULATORY_CARE_PROVIDER_SITE_OTHER): Payer: Medicare Other | Admitting: Acute Care

## 2015-08-24 ENCOUNTER — Ambulatory Visit (INDEPENDENT_AMBULATORY_CARE_PROVIDER_SITE_OTHER)
Admission: RE | Admit: 2015-08-24 | Discharge: 2015-08-24 | Disposition: A | Discharge status: Home / Self Care | Payer: Medicare Other | Source: Ambulatory Visit | Attending: Acute Care | Admitting: Acute Care

## 2015-08-24 DIAGNOSIS — F17210 Nicotine dependence, cigarettes, uncomplicated: Secondary | ICD-10-CM

## 2015-08-24 DIAGNOSIS — J439 Emphysema, unspecified: Secondary | ICD-10-CM | POA: Diagnosis not present

## 2015-08-24 NOTE — Progress Notes (Signed)
Shared Decision Making Visit Lung Cancer Screening Program [phone])   Eligibility:  Age 68 y.o.  Pack Years Smoking History Calculation: 45 pack years (# packs/per year x # years smoked)  Recent History of coughing up blood  no  Unexplained weight loss? no ( >Than 15 pounds within the last 6 months )  Prior History Lung / other cancer no (Diagnosis within the last 5 years already requiring surveillance chest CT Scans).  Smoking Status Current Smoker Former Smokers: Years since quit: NA, Current smoker Quit Date: NA ; current smoker  Visit Components:  Discussion included one or more decision making aids. yes  Discussion included risk/benefits of screening. yes  Discussion included potential follow up diagnostic testing for abnormal scans. yes  Discussion included meaning and risk of over diagnosis. yes  Discussion included meaning and risk of False Positives. yes  Discussion included meaning of total radiation exposure. yes  Counseling Included:  Importance of adherence to annual lung cancer LDCT screening. yes  Impact of comorbidities on ability to participate in the program. yes  Ability and willingness to under diagnostic treatment. yes  Smoking Cessation Counseling:  Current Smokers:   Discussed importance of smoking cessation. yes  Information about tobacco cessation classes and interventions provided to patient. yes  Patient provided with "ticket" for LDCT Scan. yes  Symptomatic Patient. no  Counseling: NA   Diagnosis Code: Tobacco Use Z72.0  Asymptomatic Patient yes  Counseling (Intermediate counseling: > three minutes counseling) [id number]  Former Smokers:   Discussed the importance of maintaining cigarette abstinence. NA; CURRENT SMOKER  Diagnosis Code: Personal History of Nicotine Dependence. [id number]  Information about tobacco cessation classes and interventions provided to patient.NA; current smoker  Patient provided with "ticket" for LDCT  Scan. yes  Written Order for Lung Cancer Screening with LDCT placed in Epic. Yes (CT Chest Lung Cancer Screening Low Dose W/O CM) [id number] Z12.2-Screening of respiratory organs Z87.891-Personal history of nicotine dependence  I spent 15 minutes of face to face time with Teresa Ford discussing the risks and benefits of lung cancer screening . We viewed a power point that explained the above noted topics in detail, pausing at intervals to allow for questions to be asked and answered to ensure understanding. We discussed that the single most powerful action that she can take to decrease her risk of developing lung cancer is to quit smoking. She is not ready to set a quit date, but knows she needs to quit.I have given Teresa Ford the "Be Stronger Than Your Excuses"  Card. We discussed the APPS available to assist in quitting smoking, as well as the use of nicotine replacement, non-nicotine medications,Quit Smart Classes , support groups and behavior modification.I have also given her my card and contact information and asked her to call me when she is ready to quit so that I can help her in successfully liberating herself from cigarettes. She has used Wellbutrin and Chantix in the past, both unsuccessfully. She also stated that she and her husband would need to try to quit together as it is difficult to quit with another smoker in the house. We discussed the time and location of her scan and that either myself or June Leap , CMA will call her with the results 24-48 hours after we receive them.I gave her a copy of the power point we viewed to refer to if needed in the future. Teresa Ford verbalized understanding of all of the above and had no further questions upon leaving the office.  Magdalen Spatz, NP

## 2015-10-05 ENCOUNTER — Encounter: Payer: Self-pay | Admitting: Cardiovascular Disease

## 2015-10-07 IMAGING — US US RENAL
1 series · 14 of 25 positions shown · non-contrast
Comparison: 08/13/2014.

CLINICAL DATA: Renal cyst.

EXAM:
RENAL/URINARY TRACT ULTRASOUND COMPLETE

[Series 1: us renal · 0.24mm/px · 14 of 33 slices shown]
[im 1/33]
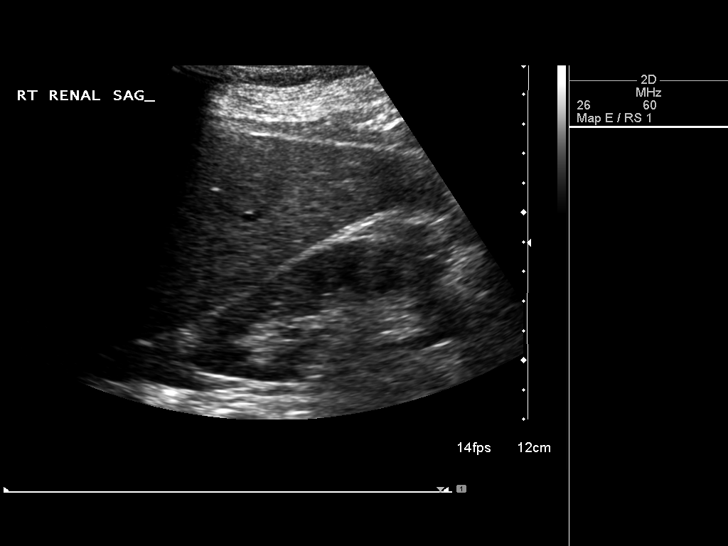
[im 3/33]
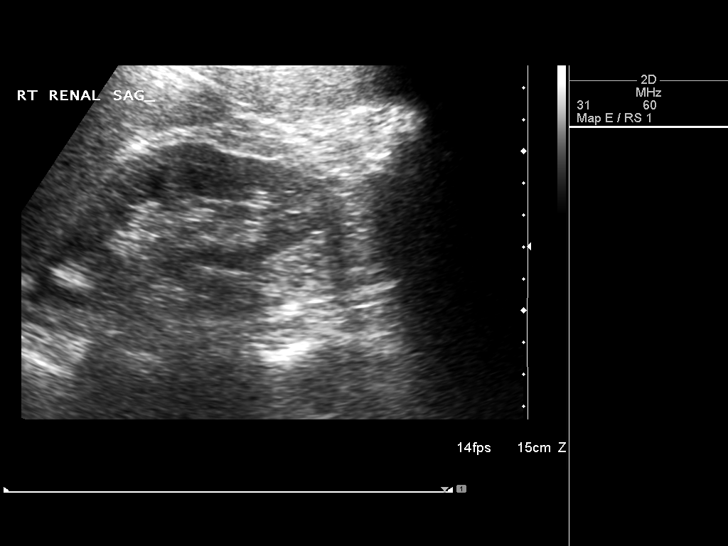
[im 6/33]
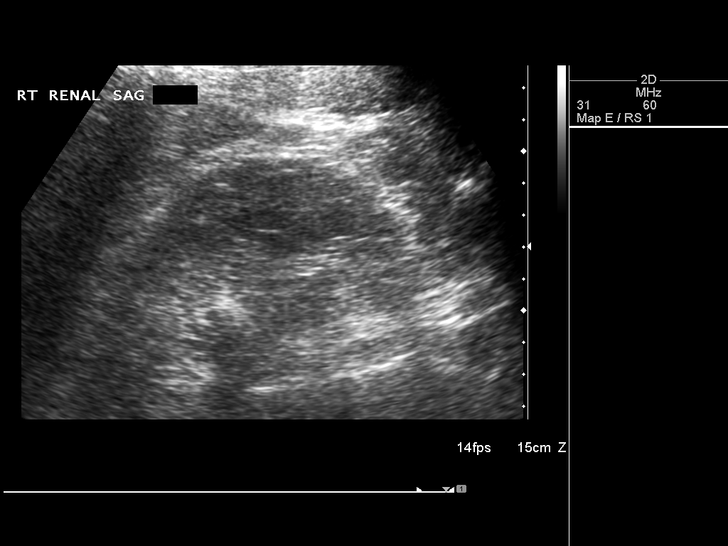
[im 9/33]
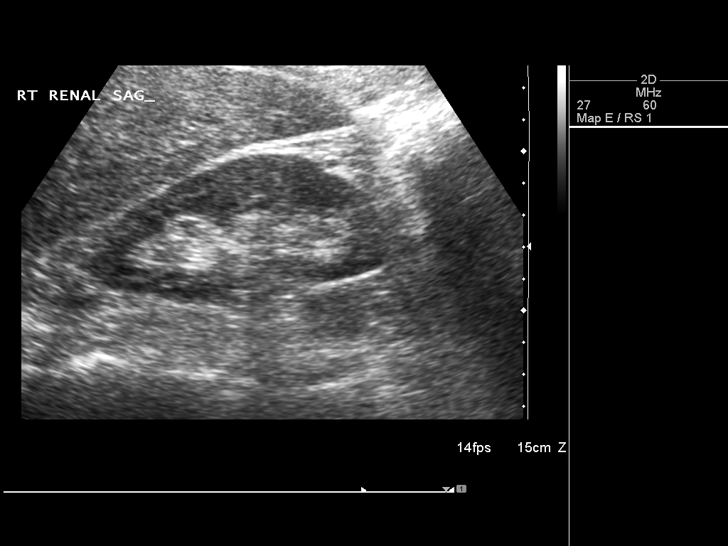
[im 11/33]
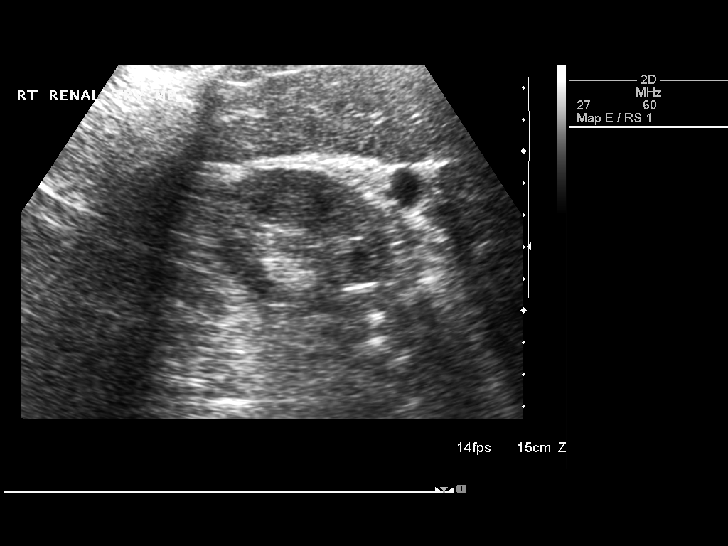
[im 13/33]
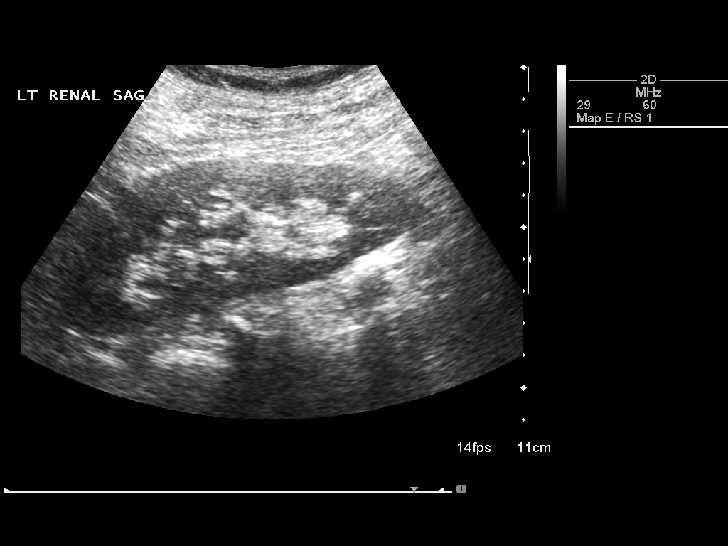
[im 15/33]
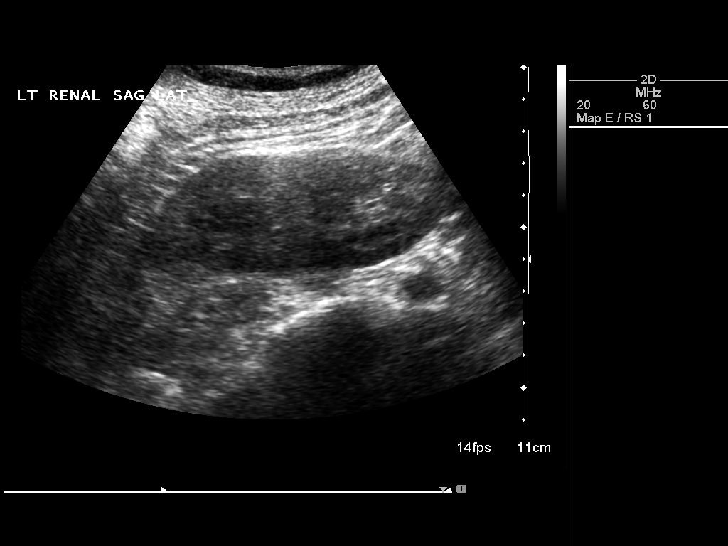
[im 18/33]
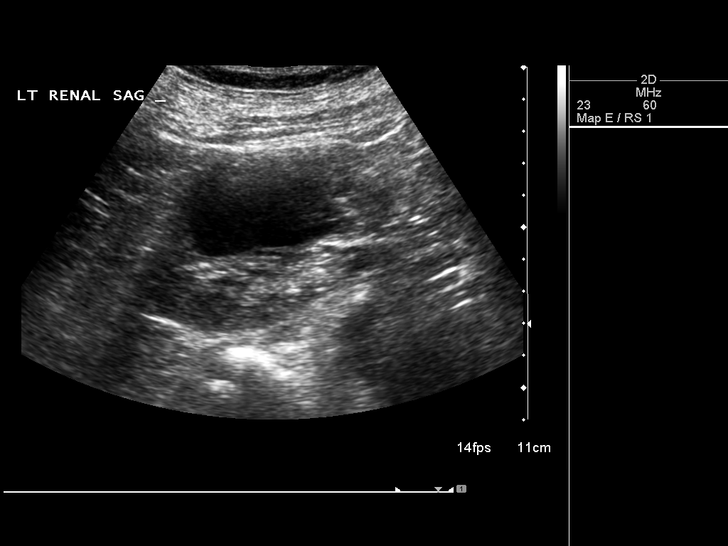
[im 21/33]
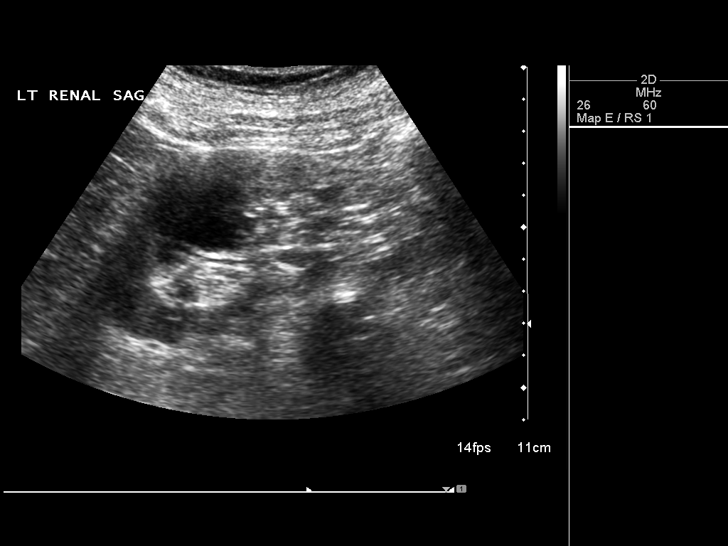
[im 22/33]
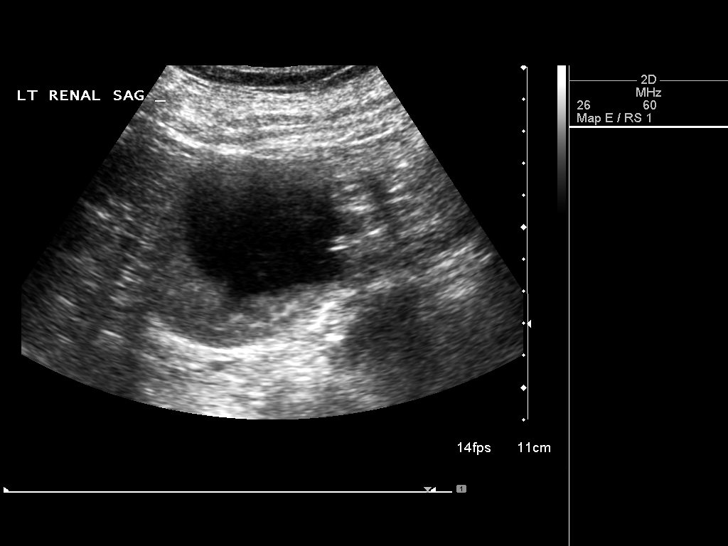
[im 25/33]
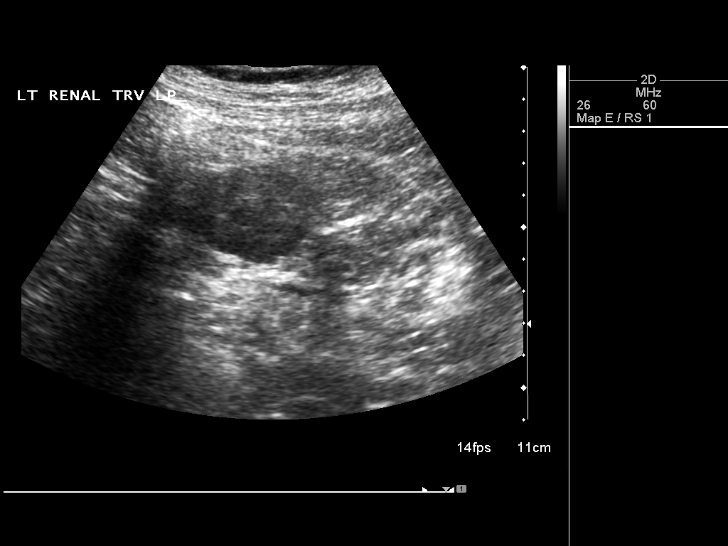
[im 27/33]
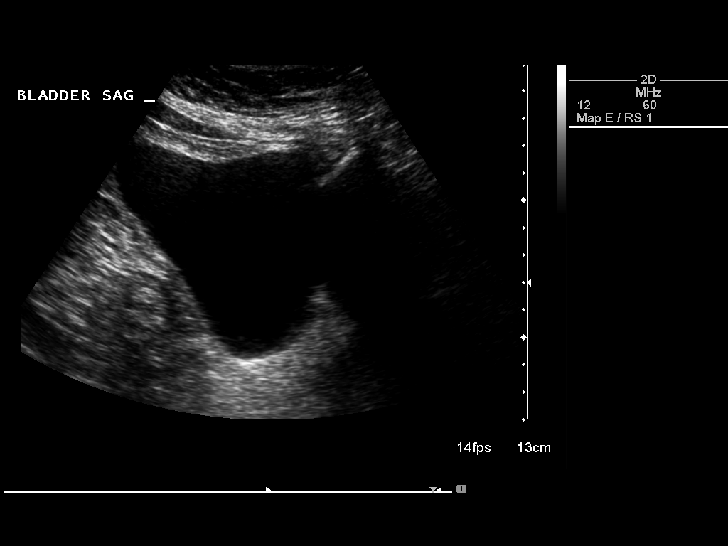
[im 30/33]
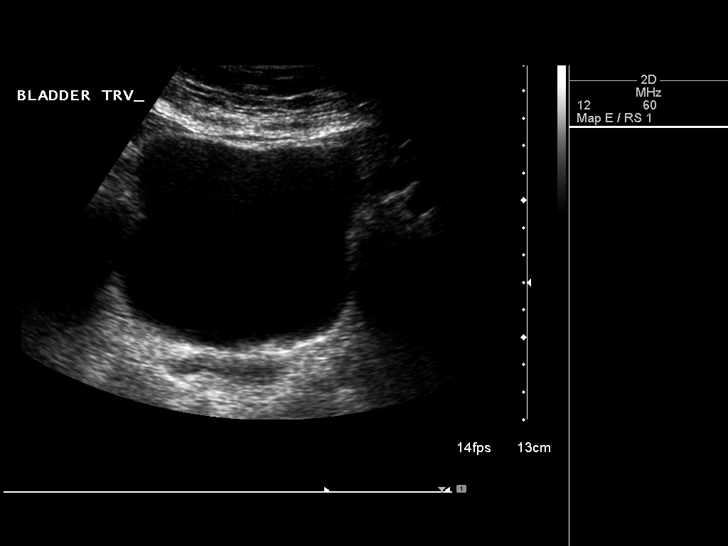
[im 33/33]
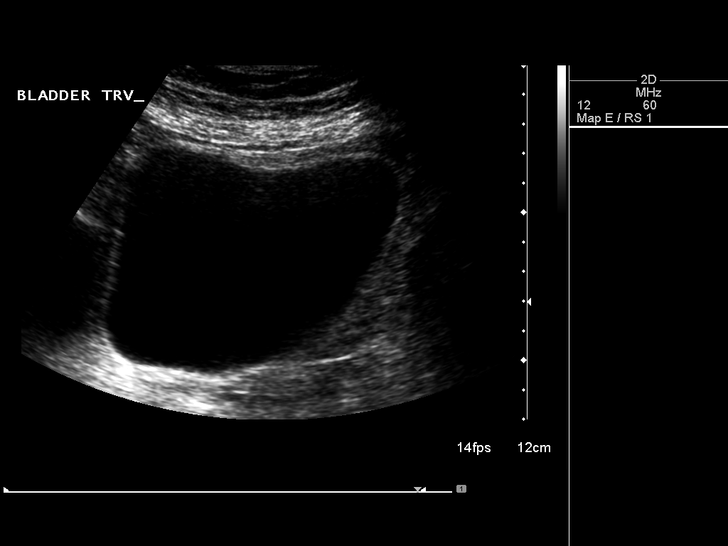

[14 of 25 positions shown; findings below may reference images not displayed]

FINDINGS: Right Kidney:

Length: 10.8 cm. Echogenicity within normal limits. No mass or
hydronephrosis visualized.

Left Kidney:

Length: 11.7 cm. Echogenicity within normal limits. No
hydronephrosis visualized. 6.4 cm simple left upper pole renal cyst
corresponds CT abnormality.

Bladder:

Appears normal for degree of bladder distention.
IMPRESSION: 6.4 cm left upper renal pole cyst corresponds to recent CT
abnormality. No significant abnormality identified.

## 2015-10-13 ENCOUNTER — Ambulatory Visit (INDEPENDENT_AMBULATORY_CARE_PROVIDER_SITE_OTHER): Payer: Medicare Other | Admitting: Cardiovascular Disease

## 2015-10-13 ENCOUNTER — Encounter: Payer: Self-pay | Admitting: Cardiovascular Disease

## 2015-10-13 VITALS — BP 138/82 | HR 72 | Ht 63.5 in | Wt 158.0 lb

## 2015-10-13 DIAGNOSIS — I2510 Atherosclerotic heart disease of native coronary artery without angina pectoris: Secondary | ICD-10-CM | POA: Diagnosis not present

## 2015-10-13 NOTE — Progress Notes (Signed)
Cardiology Office Note   Date:  10/13/2015   ID:  Teresa Ford, DOB 03-28-1946, MRN [id number]  PCP:  Lujean Amel, MD  Cardiologist:   Thayer Headings, MD   Chief Complaint  Patient presents with  . Coronary Artery Disease    problem list 1. Coronary artery disease  2. Hyperlipidemia  3. Mild carotid artery disease: This was noted on lif[facility] screening.    History of Present Illness: Teresa Ford is a 70 y.o. female who presents for  Evaluation of coronary artery calcifications.  neck he had a CT scan of her chest to look for lung nodules. She was noted to have extensive calcification of all 3 coronary arteries. She denies any chest pain .   She has chronic dyspnea with exertion  ( walking up stairs )   Walks on occasion - walks her dogs on occasion  Smokes 1 ppd.  Sometimes a bit more.    Past Medical History  Diagnosis Date  . Mixed incontinence 2010  . Hyperlipidemia   . Arthritis   . Tobacco abuse   . Depression   . Anxiety   . Prediabetes   . Overweight   . Renal cyst     left    Past Surgical History  Procedure Laterality Date  . Bunionectomy  12/2002  . Vaginal hysterectomy  1976  . Breast surgery  1972    benign tumor removed rt breast     Current Outpatient Prescriptions  Medication Sig Dispense Refill  . aspirin 81 MG tablet Take 81 mg by mouth daily.    . Calcium Carbonate-Vitamin D (CALCIUM 600/VITAMIN D PO) Take 2 tablets by mouth daily.    . cetirizine (ZYRTEC) 10 MG tablet Take 10 mg by mouth as needed for allergies.    . Multiple Vitamins-Minerals (CENTRUM SILVER PO) Take by mouth daily.    . RESTASIS 0.05 % ophthalmic emulsion     . rosuvastatin (CRESTOR) 40 MG tablet Take 40 mg by mouth daily.    . solifenacin (VESICARE) 5 MG tablet Take 1 tablet (5 mg total) by mouth daily. 90 tablet 4  . vitamin E (VITAMIN E) 400 UNIT capsule Take 400 Units by mouth daily.     No current facility-administered medications for this visit.     Allergies:   Lamisil    Social History:  The patient  reports that she has been smoking Cigarettes.  She has a 40 pack-year smoking history. She has never used smokeless tobacco. She reports that she drinks about 1.2 - 1.8 oz of alcohol per week. She reports that she does not use illicit drugs.   Family History:  The patient's family history includes Dementia in her mother; Diabetes in her father; Heart attack in her father; Heart disease in her mother; Osteoporosis in her mother.    ROS:  Please see the history of present illness.    Review of Systems: Constitutional:  denies fever, chills, diaphoresis, appetite change and fatigue.  HEENT: denies photophobia, eye pain, redness, hearing loss, ear pain, congestion, sore throat, rhinorrhea, sneezing, neck pain, neck stiffness and tinnitus.  Respiratory: denies SOB, DOE, cough, chest tightness, and wheezing.  Cardiovascular: denies chest pain, palpitations and leg swelling.  Gastrointestinal: denies nausea, vomiting, abdominal pain, diarrhea, constipation, blood in stool.  Genitourinary: denies dysuria, urgency, frequency, hematuria, flank pain and difficulty urinating.  Musculoskeletal: denies  myalgias, back pain, joint swelling, arthralgias and gait problem.   Skin: denies pallor, rash and wound.  Neurological: denies dizziness, seizures, syncope, weakness, light-headedness, numbness and headaches.   Hematological: denies adenopathy, easy bruising, personal or family bleeding history.  Psychiatric/ Behavioral: denies suicidal ideation, mood changes, confusion, nervousness, sleep disturbance and agitation.       All other systems are reviewed and negative.    PHYSICAL EXAM: VS:  BP 138/82 mmHg  Pulse 72  Ht 5' 3.5" (1.613 m)  Wt 158 lb (71.668 kg)  BMI 27.55 kg/m2  LMP 09/24/1974 , BMI Body mass index is 27.55 kg/(m^2). GEN: Well nourished, well developed, in no acute distress HEENT: normal Neck: no JVD, carotid bruits, or  masses Cardiac: RRR; soft systolic  murmur,  No rubs, or gallops,no edema  Respiratory:  clear to auscultation bilaterally, normal work of breathing GI: soft, nontender, nondistended, + BS MS: no deformity or atrophy Skin: warm and dry, no rash Neuro:  Strength and sensation are intact Psych: normal   EKG:  EKG is ordered today. The ekg ordered today demonstrates NSR at 74.   Poor R wave progression .    Recent Labs: No results found for requested labs within last 365 days.    Lipid Panel No results found for: CHOL, TRIG, HDL, CHOLHDL, VLDL, LDLCALC, LDLDIRECT    Wt Readings from Last 3 Encounters:  10/13/15 158 lb (71.668 kg)  01/17/15 159 lb (72.122 kg)  01/07/13 158 lb (71.668 kg)      Other studies Reviewed: Additional studies/ records that were reviewed today include:. Review of the above records demonstrates:    ASSESSMENT AND PLAN:  1.   Coronary artery disease: I reviewed the records from her medical doctor. She has a history of hyponatremia. She has a long time smoking history.  we'll get a [employer] study for further evaluation. She currently has no symptoms. She understands the limitations of her Myoview study and the fact that it has not predicted value for future acute coronary events.  I've encouraged her to stop smoking.  Current medicines are reviewed at length with the patient today.  The patient does not have concerns regarding medicines.  The following changes have been made:  no change  Labs/ tests ordered today include:  No orders of the defined types were placed in this encounter.     Disposition:   FU with me as needed.       Nahser, Wonda Cheng, MD  10/13/2015 2:53 PM    [facility] [place], [place], [place]  [address] Phone: [phone]; Fax: [phone]   [facility]  [address] [place], [place]  [address] [phone]   Fax [phone]

## 2015-10-13 NOTE — Patient Instructions (Signed)
Medication Instructions:  Your physician recommends that you continue on your current medications as directed. Please refer to the Current Medication list given to you today.   Labwork: None Ordered   Testing/Procedures: Your physician has requested that you have a lexiscan myoview. For further information please visit [email or web address]. Please follow instruction sheet, as given.   Follow-Up: Your physician recommends that you schedule a follow-up appointment in: as needed with Dr. Nahser.    If you need a refill on your cardiac medications before your next appointment, please call your pharmacy.   Thank you for choosing [facility]! Jovann Luse, RN [phone]   

## 2015-10-20 ENCOUNTER — Telehealth (HOSPITAL_COMMUNITY): Payer: Self-pay | Admitting: *Deleted

## 2015-10-20 NOTE — Telephone Encounter (Signed)
Patient given detailed instructions per Myocardial Perfusion Study Information Sheet for the test on 10/25/15 at 945. Patient notified to arrive 15 minutes early and that it is imperative to arrive on time for appointment to keep from having the test rescheduled.  If you need to cancel or reschedule your appointment, please call the office within 24 hours of your appointment. Failure to do so may result in a cancellation of your appointment, and a $50 no show fee. Patient verbalized understanding.Hubbard Robinson, RN

## 2015-10-25 ENCOUNTER — Ambulatory Visit (HOSPITAL_COMMUNITY): Payer: Medicare Other | Attending: Cardiology

## 2015-10-25 DIAGNOSIS — F17200 Nicotine dependence, unspecified, uncomplicated: Secondary | ICD-10-CM | POA: Insufficient documentation

## 2015-10-25 DIAGNOSIS — R0609 Other forms of dyspnea: Secondary | ICD-10-CM | POA: Insufficient documentation

## 2015-10-25 DIAGNOSIS — I10 Essential (primary) hypertension: Secondary | ICD-10-CM | POA: Insufficient documentation

## 2015-10-25 DIAGNOSIS — I779 Disorder of arteries and arterioles, unspecified: Secondary | ICD-10-CM | POA: Diagnosis not present

## 2015-10-25 DIAGNOSIS — I2510 Atherosclerotic heart disease of native coronary artery without angina pectoris: Secondary | ICD-10-CM | POA: Diagnosis not present

## 2015-10-25 DIAGNOSIS — R9439 Abnormal result of other cardiovascular function study: Secondary | ICD-10-CM | POA: Insufficient documentation

## 2015-10-25 LAB — MYOCARDIAL PERFUSION IMAGING
CHL CUP NUCLEAR SRS: 3
CHL CUP NUCLEAR SSS: 4
CHL CUP RESTING HR STRESS: 67 {beats}/min
LV dias vol: 54 mL
LVSYSVOL: 13 mL
Peak HR: 102 {beats}/min
RATE: 0.34
SDS: 1
TID: 0.93

## 2015-10-25 MED ORDER — TECHNETIUM TC 99M SESTAMIBI GENERIC - CARDIOLITE
32.6000 | Freq: Once | INTRAVENOUS | Status: AC | PRN
  Administered 2015-10-25: 33 via INTRAVENOUS

## 2015-10-25 MED ORDER — REGADENOSON 0.4 MG/5ML IV SOLN
0.4000 mg | Freq: Once | INTRAVENOUS | Status: AC
  Administered 2015-10-25: 0.4 mg via INTRAVENOUS

## 2015-10-25 MED ORDER — TECHNETIUM TC 99M SESTAMIBI GENERIC - CARDIOLITE
10.4000 | Freq: Once | INTRAVENOUS | Status: AC | PRN
  Administered 2015-10-25: 10 via INTRAVENOUS

## 2015-12-13 ENCOUNTER — Other Ambulatory Visit: Payer: Self-pay | Admitting: Acute Care

## 2015-12-13 DIAGNOSIS — F17210 Nicotine dependence, cigarettes, uncomplicated: Principal | ICD-10-CM

## 2016-01-17 DIAGNOSIS — H2513 Age-related nuclear cataract, bilateral: Secondary | ICD-10-CM | POA: Diagnosis not present

## 2016-01-17 DIAGNOSIS — H01024 Squamous blepharitis left upper eyelid: Secondary | ICD-10-CM | POA: Diagnosis not present

## 2016-01-17 DIAGNOSIS — H01021 Squamous blepharitis right upper eyelid: Secondary | ICD-10-CM | POA: Diagnosis not present

## 2016-01-17 DIAGNOSIS — H16223 Keratoconjunctivitis sicca, not specified as Sjogren's, bilateral: Secondary | ICD-10-CM | POA: Diagnosis not present

## 2016-01-18 ENCOUNTER — Ambulatory Visit: Payer: Medicare Other | Admitting: Certified Nurse Midwife

## 2016-01-19 ENCOUNTER — Telehealth: Payer: Self-pay | Admitting: Certified Nurse Midwife

## 2016-01-19 NOTE — Telephone Encounter (Signed)
Medication refill request: Vesicare 5 mg  Last AEX:  01/17/2015 with DL   Next AEX: No AEX scheduled Last MMG (if hormonal medication request): n/a  Refill authorized: ?  Called patient to schedule her for AEX patient says medicare only covers her OV every 2 years. I did mention to the patient that we do like to follow up with our patient yearly. Patient wanting to know if it's okay for her to come every 2 years instead of yearly.  Please advise Ms. Jackelyn Poling

## 2016-01-20 ENCOUNTER — Telehealth: Payer: Self-pay | Admitting: Certified Nurse Midwife

## 2016-01-20 NOTE — Telephone Encounter (Signed)
We feel exam is important. If her PCP is doing breast exam and pelvic exam she can do every two years. But she needs this done yearly is the recommendation. I gave her one refill

## 2016-01-20 NOTE — Telephone Encounter (Signed)
Patient notified (see refill encounter).

## 2016-01-20 NOTE — Telephone Encounter (Signed)
Patient is returning Jasmine's call about a refill request. Please see refill encounter.

## 2016-01-20 NOTE — Telephone Encounter (Signed)
Left Message To Call Back  

## 2016-01-20 NOTE — Telephone Encounter (Signed)
Patient says she is going to see her PCP every two years going forward, patient declined to schedule aware that rx has been sent in for #90 only.

## 2016-02-06 DIAGNOSIS — R7303 Prediabetes: Secondary | ICD-10-CM | POA: Diagnosis not present

## 2016-02-06 DIAGNOSIS — R918 Other nonspecific abnormal finding of lung field: Secondary | ICD-10-CM | POA: Diagnosis not present

## 2016-02-06 DIAGNOSIS — N3281 Overactive bladder: Secondary | ICD-10-CM | POA: Diagnosis not present

## 2016-02-06 DIAGNOSIS — R739 Hyperglycemia, unspecified: Secondary | ICD-10-CM | POA: Diagnosis not present

## 2016-02-06 DIAGNOSIS — I2510 Atherosclerotic heart disease of native coronary artery without angina pectoris: Secondary | ICD-10-CM | POA: Diagnosis not present

## 2016-02-27 ENCOUNTER — Ambulatory Visit (INDEPENDENT_AMBULATORY_CARE_PROVIDER_SITE_OTHER)
Admission: RE | Admit: 2016-02-27 | Discharge: 2016-02-27 | Disposition: A | Discharge status: Home / Self Care | Payer: Medicare Other | Source: Ambulatory Visit | Attending: Acute Care | Admitting: Acute Care

## 2016-02-27 DIAGNOSIS — R911 Solitary pulmonary nodule: Secondary | ICD-10-CM

## 2016-02-27 DIAGNOSIS — J984 Other disorders of lung: Secondary | ICD-10-CM | POA: Diagnosis not present

## 2016-02-27 DIAGNOSIS — F17210 Nicotine dependence, cigarettes, uncomplicated: Principal | ICD-10-CM

## 2016-05-21 DIAGNOSIS — J209 Acute bronchitis, unspecified: Secondary | ICD-10-CM | POA: Diagnosis not present

## 2016-05-21 DIAGNOSIS — J432 Centrilobular emphysema: Secondary | ICD-10-CM | POA: Diagnosis not present

## 2016-06-13 ENCOUNTER — Other Ambulatory Visit: Payer: Self-pay | Admitting: Family Medicine

## 2016-06-13 DIAGNOSIS — Z1231 Encounter for screening mammogram for malignant neoplasm of breast: Secondary | ICD-10-CM

## 2016-07-09 ENCOUNTER — Ambulatory Visit
Admission: RE | Admit: 2016-07-09 | Discharge: 2016-07-09 | Disposition: A | Discharge status: Home / Self Care | Payer: Medicare Other | Source: Ambulatory Visit | Attending: Family Medicine | Admitting: Family Medicine

## 2016-07-09 DIAGNOSIS — Z1231 Encounter for screening mammogram for malignant neoplasm of breast: Secondary | ICD-10-CM

## 2016-07-25 DIAGNOSIS — D126 Benign neoplasm of colon, unspecified: Secondary | ICD-10-CM | POA: Diagnosis not present

## 2016-07-25 DIAGNOSIS — D120 Benign neoplasm of cecum: Secondary | ICD-10-CM | POA: Diagnosis not present

## 2016-07-25 DIAGNOSIS — Z1211 Encounter for screening for malignant neoplasm of colon: Secondary | ICD-10-CM | POA: Diagnosis not present

## 2016-07-31 DIAGNOSIS — D126 Benign neoplasm of colon, unspecified: Secondary | ICD-10-CM | POA: Diagnosis not present

## 2016-07-31 DIAGNOSIS — Z1211 Encounter for screening for malignant neoplasm of colon: Secondary | ICD-10-CM | POA: Diagnosis not present

## 2016-08-21 DIAGNOSIS — I2510 Atherosclerotic heart disease of native coronary artery without angina pectoris: Secondary | ICD-10-CM | POA: Diagnosis not present

## 2016-08-21 DIAGNOSIS — R918 Other nonspecific abnormal finding of lung field: Secondary | ICD-10-CM | POA: Diagnosis not present

## 2016-08-21 DIAGNOSIS — R7303 Prediabetes: Secondary | ICD-10-CM | POA: Diagnosis not present

## 2016-08-21 DIAGNOSIS — Z0001 Encounter for general adult medical examination with abnormal findings: Secondary | ICD-10-CM | POA: Diagnosis not present

## 2016-08-21 DIAGNOSIS — E7800 Pure hypercholesterolemia, unspecified: Secondary | ICD-10-CM | POA: Diagnosis not present

## 2016-08-21 DIAGNOSIS — Z79899 Other long term (current) drug therapy: Secondary | ICD-10-CM | POA: Diagnosis not present

## 2016-08-21 DIAGNOSIS — Z23 Encounter for immunization: Secondary | ICD-10-CM | POA: Diagnosis not present

## 2016-08-21 DIAGNOSIS — J432 Centrilobular emphysema: Secondary | ICD-10-CM | POA: Diagnosis not present

## 2016-09-05 ENCOUNTER — Other Ambulatory Visit: Payer: Self-pay | Admitting: Acute Care

## 2016-09-05 DIAGNOSIS — F17210 Nicotine dependence, cigarettes, uncomplicated: Secondary | ICD-10-CM

## 2016-09-25 ENCOUNTER — Other Ambulatory Visit: Payer: Self-pay | Admitting: Family Medicine

## 2016-09-25 DIAGNOSIS — R0600 Dyspnea, unspecified: Secondary | ICD-10-CM

## 2016-09-26 ENCOUNTER — Ambulatory Visit (INDEPENDENT_AMBULATORY_CARE_PROVIDER_SITE_OTHER): Payer: Medicare Other | Admitting: Internal Medicine

## 2016-09-26 DIAGNOSIS — R0600 Dyspnea, unspecified: Secondary | ICD-10-CM | POA: Diagnosis not present

## 2016-09-26 LAB — PULMONARY FUNCTION TEST
DL/VA % pred: 71 %
DL/VA: 3.4 ml/min/mmHg/L
DLCO cor % pred: 67 %
DLCO cor: 15.98 ml/min/mmHg
DLCO unc % pred: 66 %
DLCO unc: 15.68 ml/min/mmHg
FEF 25-75 Post: 0.87 L/sec
FEF 25-75 Pre: 0.54 L/sec
FEF2575-%Change-Post: 60 %
FEF2575-%Pred-Post: 46 %
FEF2575-%Pred-Pre: 29 %
FEV1-%CHANGE-POST: 15 %
FEV1-%PRED-POST: 62 %
FEV1-%Pred-Pre: 54 %
FEV1-POST: 1.39 L
FEV1-PRE: 1.2 L
FEV1FVC-%Change-Post: 7 %
FEV1FVC-%PRED-PRE: 74 %
FEV6-%Change-Post: 8 %
FEV6-%PRED-POST: 80 %
FEV6-%Pred-Pre: 74 %
FEV6-POST: 2.26 L
FEV6-PRE: 2.08 L
FEV6FVC-%CHANGE-POST: 1 %
FEV6FVC-%PRED-POST: 103 %
FEV6FVC-%PRED-PRE: 102 %
FVC-%CHANGE-POST: 7 %
FVC-%Pred-Post: 77 %
FVC-%Pred-Pre: 72 %
FVC-Post: 2.28 L
FVC-Pre: 2.12 L
PRE FEV6/FVC RATIO: 98 %
Post FEV1/FVC ratio: 61 %
Post FEV6/FVC ratio: 99 %
Pre FEV1/FVC ratio: 57 %
RV % PRED: 152 %
RV: 3.29 L
TLC % PRED: 112 %
TLC: 5.62 L

## 2016-10-03 ENCOUNTER — Telehealth: Payer: Self-pay | Admitting: Internal Medicine

## 2016-10-03 NOTE — Telephone Encounter (Signed)
Spoke with Teresa Ford at [facility], needs completed pft faxed to them.  This has been sent.  Nothing further needed.

## 2017-01-18 ENCOUNTER — Encounter: Payer: Self-pay | Admitting: Certified Nurse Midwife

## 2017-01-18 ENCOUNTER — Ambulatory Visit (INDEPENDENT_AMBULATORY_CARE_PROVIDER_SITE_OTHER): Payer: Medicare Other | Admitting: Certified Nurse Midwife

## 2017-01-18 VITALS — BP 136/82 | HR 68 | Resp 16 | Ht 63.25 in | Wt 162.0 lb

## 2017-01-18 DIAGNOSIS — Z01419 Encounter for gynecological examination (general) (routine) without abnormal findings: Secondary | ICD-10-CM

## 2017-01-18 DIAGNOSIS — N951 Menopausal and female climacteric states: Secondary | ICD-10-CM | POA: Diagnosis not present

## 2017-01-18 DIAGNOSIS — Z124 Encounter for screening for malignant neoplasm of cervix: Secondary | ICD-10-CM | POA: Diagnosis not present

## 2017-01-18 NOTE — Progress Notes (Signed)
71 y.o. G2P2 Married  Caucasian Fe here for annual exam. Menopausal no HRT. Denies vaginal bleeding. Occasional vaginal dryness. Still sexually active at times without issues. Staying active with canning and other activities. Sees PCP Dr. Sandrea Matte for aex/labs, diabetes and COPD management. Still smoking and does not plan to stop, aware of risks.Sporadic incontinence, but no issues now. Had colonoscopy with benign polyp, will need repeat 5 years. No health issues today.  Patient's last menstrual period was 09/24/1974.          Sexually active: Yes.    The current method of family planning is status post hysterectomy.    Exercising: No.  exercise Smoker:  yes  Health Maintenance: Pap:  12-25-02 neg History of Abnormal Pap: no MMG:  07-09-16 category b density, birads 1 Self Breast exams: yes Colonoscopy: 11/17 polyp f/u 41yrs BMD:   2015 normal TDaP:  2015 Shingles: not done Pneumonia: 2015 Hep C and HIV: not done Labs: pcp   reports that she has been smoking Cigarettes.  She has a 40.00 pack-year smoking history. She has never used smokeless tobacco. She reports that she drinks about 1.8 - 2.4 oz of alcohol per week . She reports that she does not use drugs.  Past Medical History:  Diagnosis Date  . Anxiety   . Arthritis   . COPD (chronic obstructive pulmonary disease) ([place])   . Depression   . Hyperlipidemia   . Mixed incontinence 2010  . Overweight   . Prediabetes   . Renal cyst    left  . Tobacco abuse     Past Surgical History:  Procedure Laterality Date  . BREAST SURGERY  1972   benign tumor removed rt breast  . BUNIONECTOMY  12/2002  . VAGINAL HYSTERECTOMY  1976    Current Outpatient Prescriptions  Medication Sig Dispense Refill  . aspirin 81 MG tablet Take 81 mg by mouth daily.    . Calcium Carbonate-Vitamin D (CALCIUM 600/VITAMIN D PO) Take 2 tablets by mouth daily.    . cetirizine (ZYRTEC) 10 MG tablet Take 10 mg by mouth as needed for allergies.    . Multiple  Vitamins-Minerals (CENTRUM SILVER PO) Take by mouth daily.    . RESTASIS 0.05 % ophthalmic emulsion     . rosuvastatin (CRESTOR) 40 MG tablet Take 40 mg by mouth daily.    . vitamin E (VITAMIN E) 400 UNIT capsule Take 400 Units by mouth daily.     No current facility-administered medications for this visit.     Family History  Problem Relation Age of Onset  . Heart disease Mother   . Dementia Mother   . Osteoporosis Mother   . Diabetes Father   . Heart attack Father     ROS:  Pertinent items are noted in HPI.  Otherwise, a comprehensive ROS was negative.  Exam:   BP 136/82   Pulse 68   Resp 16   Ht 5' 3.25" (1.607 m)   Wt 162 lb (73.5 kg)   LMP 09/24/1974   BMI 28.47 kg/m  Height: 5' 3.25" (160.7 cm) Ht Readings from Last 3 Encounters:  01/18/17 5' 3.25" (1.607 m)  10/25/15 5\' 3"  (1.6 m)  10/13/15 5' 3.5" (1.613 m)    General appearance: alert, cooperative and appears stated age Head: Normocephalic, without obvious abnormality, atraumatic Neck: no adenopathy, supple, symmetrical, trachea midline and thyroid normal to inspection and palpation Lungs: clear to auscultation bilaterally Breasts: normal appearance, no masses or tenderness, No nipple retraction or  dimpling, No nipple discharge or bleeding, No axillary or supraclavicular adenopathyd, skin under breasts bilateral slight pink from perspiration, no cracking or exudate Heart: regular rate and rhythm Abdomen: soft, non-tender; no masses,  no organomegaly Extremities: extremities normal, atraumatic, no cyanosis or edema Skin: Skin color, texture, turgor normal. No rashes or lesions Lymph nodes: Cervical, supraclavicular, and axillary nodes normal. No abnormal inguinal nodes palpated Neurologic: Grossly normal   Pelvic: External genitalia:  no lesions              Urethra:  normal appearing urethra with no masses, tenderness or lesions              Bartholin's and Skene's: normal                 Vagina: normal  appearing vagina with normal color and discharge, no lesions              Cervix: absent              Pap taken: No. Bimanual Exam:  Uterus:  uterus absent              Adnexa: normal adnexa and no mass, fullness, tenderness               Rectovaginal: Confirms               Anus:  normal sphincter tone, no lesions  Chaperone present: yes  A:  Well Woman with normal exam  Menopausal no HRT. S/P TVH for prolapse/bleeding  Vaginal dryness  Hyperlipidemia and COPD with PCP management  Smoker, cessation assistance declined  P:   Reviewed health and wellness pertinent to exam  Discussed vaginal dryness findings and discussed treatment options. Prefers no prescription.  Discussed OTC use of Olive Oil or coconut oil and instructions given. Questions answered.  Continue follow up with PCP as indicated.  Encouraged to reduce smoking for her health benefits.  Discussed corn starch dusting under breasts to prevent irritation from perspiration. Patient will try and advise if continues to be a problem  Pap smear: no   counseled on breast self exam, mammography screening, feminine hygiene, adequate intake of calcium and vitamin D, diet and exercise  return annually or prn  An After Visit Summary was printed and given to the patient.

## 2017-01-18 NOTE — Patient Instructions (Signed)

## 2017-01-21 DIAGNOSIS — H01021 Squamous blepharitis right upper eyelid: Secondary | ICD-10-CM | POA: Diagnosis not present

## 2017-01-21 DIAGNOSIS — H04123 Dry eye syndrome of bilateral lacrimal glands: Secondary | ICD-10-CM | POA: Diagnosis not present

## 2017-01-21 DIAGNOSIS — H01024 Squamous blepharitis left upper eyelid: Secondary | ICD-10-CM | POA: Diagnosis not present

## 2017-01-21 DIAGNOSIS — H2513 Age-related nuclear cataract, bilateral: Secondary | ICD-10-CM | POA: Diagnosis not present

## 2017-02-05 ENCOUNTER — Telehealth: Payer: Self-pay | Admitting: Acute Care

## 2017-02-07 NOTE — Telephone Encounter (Signed)
Spoke with Teresa Ford and she states that she has already spoken with [name] and nothing further is needed.

## 2017-02-27 ENCOUNTER — Ambulatory Visit (INDEPENDENT_AMBULATORY_CARE_PROVIDER_SITE_OTHER)
Admission: RE | Admit: 2017-02-27 | Discharge: 2017-02-27 | Disposition: A | Discharge status: Home / Self Care | Payer: Medicare Other | Source: Ambulatory Visit | Attending: Acute Care | Admitting: Acute Care

## 2017-02-27 DIAGNOSIS — F17210 Nicotine dependence, cigarettes, uncomplicated: Secondary | ICD-10-CM

## 2017-02-27 DIAGNOSIS — Z87891 Personal history of nicotine dependence: Secondary | ICD-10-CM | POA: Diagnosis not present

## 2017-02-28 ENCOUNTER — Telehealth: Payer: Self-pay | Admitting: Acute Care

## 2017-02-28 DIAGNOSIS — F17210 Nicotine dependence, cigarettes, uncomplicated: Principal | ICD-10-CM

## 2017-02-28 NOTE — Telephone Encounter (Signed)
Pt informed of CT results per Sarah Groce, NP.  PT verbalized understanding.  Copy sent to PCP.  Order placed for 1 yr f/u CT.  

## 2017-05-29 DIAGNOSIS — K1230 Oral mucositis (ulcerative), unspecified: Secondary | ICD-10-CM | POA: Diagnosis not present

## 2017-05-29 DIAGNOSIS — L439 Lichen planus, unspecified: Secondary | ICD-10-CM | POA: Diagnosis not present

## 2017-06-05 ENCOUNTER — Other Ambulatory Visit: Payer: Self-pay | Admitting: Family Medicine

## 2017-06-05 DIAGNOSIS — Z1231 Encounter for screening mammogram for malignant neoplasm of breast: Secondary | ICD-10-CM

## 2017-06-06 DIAGNOSIS — L439 Lichen planus, unspecified: Secondary | ICD-10-CM | POA: Diagnosis not present

## 2017-06-06 DIAGNOSIS — K1230 Oral mucositis (ulcerative), unspecified: Secondary | ICD-10-CM | POA: Diagnosis not present

## 2017-07-10 ENCOUNTER — Ambulatory Visit
Admission: RE | Admit: 2017-07-10 | Discharge: 2017-07-10 | Disposition: A | Discharge status: Home / Self Care | Payer: Medicare Other | Source: Ambulatory Visit | Attending: Family Medicine | Admitting: Family Medicine

## 2017-07-10 DIAGNOSIS — Z1231 Encounter for screening mammogram for malignant neoplasm of breast: Secondary | ICD-10-CM | POA: Diagnosis not present

## 2017-08-23 DIAGNOSIS — R7303 Prediabetes: Secondary | ICD-10-CM | POA: Diagnosis not present

## 2017-08-23 DIAGNOSIS — Z23 Encounter for immunization: Secondary | ICD-10-CM | POA: Diagnosis not present

## 2017-08-23 DIAGNOSIS — J432 Centrilobular emphysema: Secondary | ICD-10-CM | POA: Diagnosis not present

## 2017-08-23 DIAGNOSIS — Z79899 Other long term (current) drug therapy: Secondary | ICD-10-CM | POA: Diagnosis not present

## 2017-08-23 DIAGNOSIS — I10 Essential (primary) hypertension: Secondary | ICD-10-CM | POA: Diagnosis not present

## 2017-08-23 DIAGNOSIS — E7800 Pure hypercholesterolemia, unspecified: Secondary | ICD-10-CM | POA: Diagnosis not present

## 2017-08-23 DIAGNOSIS — Z0001 Encounter for general adult medical examination with abnormal findings: Secondary | ICD-10-CM | POA: Diagnosis not present

## 2017-09-20 DIAGNOSIS — Z79899 Other long term (current) drug therapy: Secondary | ICD-10-CM | POA: Diagnosis not present

## 2018-02-21 DIAGNOSIS — H2513 Age-related nuclear cataract, bilateral: Secondary | ICD-10-CM | POA: Diagnosis not present

## 2018-02-21 DIAGNOSIS — H43391 Other vitreous opacities, right eye: Secondary | ICD-10-CM | POA: Diagnosis not present

## 2018-02-21 DIAGNOSIS — H16223 Keratoconjunctivitis sicca, not specified as Sjogren's, bilateral: Secondary | ICD-10-CM | POA: Diagnosis not present

## 2018-03-05 ENCOUNTER — Ambulatory Visit (INDEPENDENT_AMBULATORY_CARE_PROVIDER_SITE_OTHER)
Admission: RE | Admit: 2018-03-05 | Discharge: 2018-03-05 | Disposition: A | Discharge status: Home / Self Care | Payer: Medicare Other | Source: Ambulatory Visit | Attending: Acute Care | Admitting: Acute Care

## 2018-03-05 DIAGNOSIS — F17210 Nicotine dependence, cigarettes, uncomplicated: Secondary | ICD-10-CM

## 2018-03-11 ENCOUNTER — Other Ambulatory Visit: Payer: Self-pay | Admitting: Acute Care

## 2018-03-11 DIAGNOSIS — F17210 Nicotine dependence, cigarettes, uncomplicated: Principal | ICD-10-CM

## 2018-03-11 DIAGNOSIS — Z122 Encounter for screening for malignant neoplasm of respiratory organs: Secondary | ICD-10-CM

## 2018-04-28 DIAGNOSIS — J069 Acute upper respiratory infection, unspecified: Secondary | ICD-10-CM | POA: Diagnosis not present

## 2018-04-28 DIAGNOSIS — R0600 Dyspnea, unspecified: Secondary | ICD-10-CM | POA: Diagnosis not present

## 2018-06-03 ENCOUNTER — Other Ambulatory Visit: Payer: Self-pay | Admitting: Family Medicine

## 2018-06-03 DIAGNOSIS — Z1231 Encounter for screening mammogram for malignant neoplasm of breast: Secondary | ICD-10-CM

## 2018-07-01 DIAGNOSIS — Z23 Encounter for immunization: Secondary | ICD-10-CM | POA: Diagnosis not present

## 2018-07-11 ENCOUNTER — Ambulatory Visit
Admission: RE | Admit: 2018-07-11 | Discharge: 2018-07-11 | Disposition: A | Discharge status: Home / Self Care | Payer: Medicare Other | Source: Ambulatory Visit | Attending: Family Medicine | Admitting: Family Medicine

## 2018-07-11 DIAGNOSIS — Z1231 Encounter for screening mammogram for malignant neoplasm of breast: Secondary | ICD-10-CM | POA: Diagnosis not present

## 2018-09-02 DIAGNOSIS — I10 Essential (primary) hypertension: Secondary | ICD-10-CM | POA: Diagnosis not present

## 2018-09-02 DIAGNOSIS — Z79899 Other long term (current) drug therapy: Secondary | ICD-10-CM | POA: Diagnosis not present

## 2018-09-02 DIAGNOSIS — Z0001 Encounter for general adult medical examination with abnormal findings: Secondary | ICD-10-CM | POA: Diagnosis not present

## 2018-09-02 DIAGNOSIS — I2510 Atherosclerotic heart disease of native coronary artery without angina pectoris: Secondary | ICD-10-CM | POA: Diagnosis not present

## 2018-09-02 DIAGNOSIS — E7800 Pure hypercholesterolemia, unspecified: Secondary | ICD-10-CM | POA: Diagnosis not present

## 2018-09-02 DIAGNOSIS — R7303 Prediabetes: Secondary | ICD-10-CM | POA: Diagnosis not present

## 2018-09-02 DIAGNOSIS — J432 Centrilobular emphysema: Secondary | ICD-10-CM | POA: Diagnosis not present

## 2018-09-02 DIAGNOSIS — R918 Other nonspecific abnormal finding of lung field: Secondary | ICD-10-CM | POA: Diagnosis not present

## 2018-11-11 DIAGNOSIS — I10 Essential (primary) hypertension: Secondary | ICD-10-CM | POA: Diagnosis not present

## 2018-11-25 DIAGNOSIS — I10 Essential (primary) hypertension: Secondary | ICD-10-CM | POA: Diagnosis not present

## 2018-11-26 ENCOUNTER — Telehealth: Payer: Self-pay | Admitting: Certified Nurse Midwife

## 2018-11-26 DIAGNOSIS — E2839 Other primary ovarian failure: Secondary | ICD-10-CM

## 2018-11-26 DIAGNOSIS — N951 Menopausal and female climacteric states: Secondary | ICD-10-CM

## 2018-11-26 NOTE — Telephone Encounter (Signed)
Routing to [employer], to review and advise.  Patient's last BMD on 01-20-14 was normal.   Order for BMD pended for review.

## 2018-11-26 NOTE — Telephone Encounter (Signed)
Patient has her aex scheduled with Teresa Ford 01/21/19. She asked if she needs to have her BMD before her aex?

## 2018-11-27 ENCOUNTER — Other Ambulatory Visit: Payer: Self-pay | Admitting: Certified Nurse Midwife

## 2018-11-28 NOTE — Telephone Encounter (Signed)
Message left to return call to [place] at [phone].   Advised ordered Bone Density.  Call and schedule at her convenience.  Encounter closed.

## 2018-12-04 ENCOUNTER — Other Ambulatory Visit: Payer: Medicare Other

## 2018-12-31 ENCOUNTER — Other Ambulatory Visit: Payer: Medicare Other

## 2019-01-21 ENCOUNTER — Ambulatory Visit: Payer: Medicare Other | Admitting: Certified Nurse Midwife

## 2019-03-05 ENCOUNTER — Ambulatory Visit
Admission: RE | Admit: 2019-03-05 | Discharge: 2019-03-05 | Disposition: A | Discharge status: Home / Self Care | Payer: Medicare Other | Source: Ambulatory Visit | Attending: Certified Nurse Midwife | Admitting: Certified Nurse Midwife

## 2019-03-05 ENCOUNTER — Other Ambulatory Visit: Payer: Self-pay

## 2019-03-05 DIAGNOSIS — M8588 Other specified disorders of bone density and structure, other site: Secondary | ICD-10-CM | POA: Diagnosis not present

## 2019-03-05 DIAGNOSIS — Z780 Asymptomatic menopausal state: Secondary | ICD-10-CM | POA: Diagnosis not present

## 2019-03-05 DIAGNOSIS — E2839 Other primary ovarian failure: Secondary | ICD-10-CM

## 2019-04-03 ENCOUNTER — Other Ambulatory Visit: Payer: Self-pay | Admitting: *Deleted

## 2019-04-03 DIAGNOSIS — Z122 Encounter for screening for malignant neoplasm of respiratory organs: Secondary | ICD-10-CM

## 2019-04-03 DIAGNOSIS — F17210 Nicotine dependence, cigarettes, uncomplicated: Secondary | ICD-10-CM

## 2019-04-03 DIAGNOSIS — Z87891 Personal history of nicotine dependence: Secondary | ICD-10-CM

## 2019-04-08 ENCOUNTER — Other Ambulatory Visit: Payer: Self-pay

## 2019-04-08 ENCOUNTER — Encounter: Payer: Self-pay | Admitting: Certified Nurse Midwife

## 2019-04-08 ENCOUNTER — Ambulatory Visit (INDEPENDENT_AMBULATORY_CARE_PROVIDER_SITE_OTHER): Payer: Medicare Other | Admitting: Certified Nurse Midwife

## 2019-04-08 VITALS — BP 118/80 | HR 68 | Temp 97.2°F | Resp 16 | Ht 62.75 in | Wt 156.0 lb

## 2019-04-08 DIAGNOSIS — Z01419 Encounter for gynecological examination (general) (routine) without abnormal findings: Secondary | ICD-10-CM | POA: Diagnosis not present

## 2019-04-08 DIAGNOSIS — Z124 Encounter for screening for malignant neoplasm of cervix: Secondary | ICD-10-CM | POA: Diagnosis not present

## 2019-04-08 NOTE — Progress Notes (Signed)
73 y.o. G2P2 Married  Caucasian Fe here for annual exam. Post menopausal no vaginal dryness or pain with sexual activity. Denies any urinary or bowel issues. Bone density recently with slight decrease in spine/hip, no osteoporosis. Sees PCP yearly for labs and medication management of hypertension, cholesterol and Vitamin D. Has noted slight hip pain with movement, but no issues. Works in the garden and yard with no problem. "Glad I can". No other health issues today. Still smoking and desires no cessation information.   Patient's last menstrual period was 09/24/1974.          Sexually active: Yes.    The current method of family planning is status post hysterectomy.    Exercising: Yes.    yardwork Smoker:  yes  Review of Systems  Constitutional: Negative.   HENT: Negative.   Eyes: Negative.   Respiratory: Negative.   Cardiovascular: Negative.   Gastrointestinal: Negative.   Genitourinary: Negative.   Musculoskeletal: Negative.   Skin: Negative.   Neurological: Negative.   Endo/Heme/Allergies: Negative.   Psychiatric/Behavioral: Negative.     Health Maintenance: Pap:  12-25-02 neg History of Abnormal Pap: no MMG:  07-11-18 category b density birads 1:neg Self Breast exams: yes Colonoscopy:  11/17 polyp f/u 64yrs BMD:   2020 TDaP: 2015 Shingles: not done Pneumonia: 2015 Hep C and HIV: not done Labs: only if needed   reports that she has been smoking cigarettes. She has a 40.00 pack-year smoking history. She has never used smokeless tobacco. She reports current alcohol use of about 3.0 - 4.0 standard drinks of alcohol per week. She reports that she does not use drugs.  Past Medical History:  Diagnosis Date  . Anxiety   . Arthritis   . COPD (chronic obstructive pulmonary disease) ([place])   . Depression   . Hyperlipidemia   . Hypertension   . Mixed incontinence 2010  . Overweight   . Prediabetes   . Renal cyst    left  . Tobacco abuse     Past Surgical History:   Procedure Laterality Date  . BREAST EXCISIONAL BIOPSY Right    benign  . BREAST SURGERY  1972   benign tumor removed rt breast  . BUNIONECTOMY  12/2002  . VAGINAL HYSTERECTOMY  1976    Current Outpatient Medications  Medication Sig Dispense Refill  . aspirin 81 MG tablet Take 81 mg by mouth daily.    . Calcium Carbonate-Vitamin D (CALCIUM 600/VITAMIN D PO) Take 2 tablets by mouth daily.    . hydrochlorothiazide (HYDRODIURIL) 25 MG tablet     . INCRUSE ELLIPTA 62.5 MCG/INH AEPB     . loratadine (CLARITIN) 10 MG tablet Take 10 mg by mouth daily.    Marland Kitchen losartan (COZAAR) 50 MG tablet     . Multiple Vitamins-Minerals (CENTRUM SILVER PO) Take by mouth daily.    . Omega-3 Fatty Acids (FISH OIL PO) Take by mouth.    . RESTASIS 0.05 % ophthalmic emulsion     . rosuvastatin (CRESTOR) 40 MG tablet Take 40 mg by mouth daily.    Marland Kitchen VITAMIN D PO Take by mouth.    . vitamin E (VITAMIN E) 400 UNIT capsule Take 400 Units by mouth daily.     No current facility-administered medications for this visit.     Family History  Problem Relation Age of Onset  . Heart disease Mother   . Dementia Mother   . Osteoporosis Mother   . Diabetes Father   . Heart attack Father  ROS:  Pertinent items are noted in HPI.  Otherwise, a comprehensive ROS was negative.  Exam:   BP 118/80   Pulse 68   Temp (!) 97.2 F (36.2 C) (Skin)   Resp 16   Ht 5' 2.75" (1.594 m)   Wt 156 lb (70.8 kg)   LMP 09/24/1974   BMI 27.85 kg/m  Height: 5' 2.75" (159.4 cm) Ht Readings from Last 3 Encounters:  04/08/19 5' 2.75" (1.594 m)  01/18/17 5' 3.25" (1.607 m)  10/25/15 5\' 3"  (1.6 m)    General appearance: alert, cooperative and appears stated age Head: Normocephalic, without obvious abnormality, atraumatic Neck: no adenopathy, supple, symmetrical, trachea midline and thyroid normal to inspection and palpation Lungs: clear to auscultation bilaterally Breasts: normal appearance, no masses or tenderness, No nipple  retraction or dimpling, No nipple discharge or bleeding, No axillary or supraclavicular adenopathy Heart: regular rate and rhythm Abdomen: soft, non-tender; no masses,  no organomegaly Extremities: extremities normal, atraumatic, no cyanosis or edema Skin: Skin color, texture, turgor normal. No rashes or lesions Lymph nodes: Cervical, supraclavicular, and axillary nodes normal. No abnormal inguinal nodes palpated Neurologic: Grossly normal   Pelvic: External genitalia:  no lesions              Urethra:  normal appearing urethra with no masses, tenderness or lesions              Bartholin's and Skene's: normal                 Vagina: normal appearing vagina with normal color and discharge, no lesions              Cervix: absent              Pap taken: No. Bimanual Exam:  Uterus:  uterus absent              Adnexa: normal adnexa and no mass, fullness, tenderness               Rectovaginal: Confirms               Anus:  normal sphincter tone, no lesions  Chaperone present: yes  A:  Well Woman with normal exam  Post menopausal S/P Vaginal hysterectomy  Atrophic vaginitis, not symptomatic  Hypertension,cholesterol management with PCP  Smoker not interested in smoking cessation    P:   Reviewed health and wellness pertinent to exam  Aware of need to use vaginal moisture if needed if having discomfort.  Continue follow up with PCP as indicated  Pap smear: no   counseled on breast self exam, mammography screening, feminine hygiene, adequate intake of calcium and vitamin D, diet and exercise, Kegel's exercises  return annually or prn  An After Visit Summary was printed and given to the patient.

## 2019-05-12 ENCOUNTER — Other Ambulatory Visit: Payer: Self-pay

## 2019-05-12 ENCOUNTER — Ambulatory Visit (INDEPENDENT_AMBULATORY_CARE_PROVIDER_SITE_OTHER)
Admission: RE | Admit: 2019-05-12 | Discharge: 2019-05-12 | Disposition: A | Discharge status: Home / Self Care | Payer: Medicare Other | Source: Ambulatory Visit | Attending: Acute Care | Admitting: Acute Care

## 2019-05-12 DIAGNOSIS — Z122 Encounter for screening for malignant neoplasm of respiratory organs: Secondary | ICD-10-CM

## 2019-05-12 DIAGNOSIS — Z87891 Personal history of nicotine dependence: Secondary | ICD-10-CM | POA: Diagnosis not present

## 2019-05-12 DIAGNOSIS — F17210 Nicotine dependence, cigarettes, uncomplicated: Secondary | ICD-10-CM

## 2019-05-15 ENCOUNTER — Other Ambulatory Visit: Payer: Self-pay | Admitting: *Deleted

## 2019-05-15 DIAGNOSIS — Z122 Encounter for screening for malignant neoplasm of respiratory organs: Secondary | ICD-10-CM

## 2019-05-15 DIAGNOSIS — F17210 Nicotine dependence, cigarettes, uncomplicated: Secondary | ICD-10-CM

## 2019-05-15 DIAGNOSIS — Z87891 Personal history of nicotine dependence: Secondary | ICD-10-CM

## 2019-06-12 ENCOUNTER — Other Ambulatory Visit: Payer: Self-pay | Admitting: Family Medicine

## 2019-06-12 DIAGNOSIS — Z1231 Encounter for screening mammogram for malignant neoplasm of breast: Secondary | ICD-10-CM

## 2019-06-19 DIAGNOSIS — Z23 Encounter for immunization: Secondary | ICD-10-CM | POA: Diagnosis not present

## 2019-07-29 ENCOUNTER — Ambulatory Visit
Admission: RE | Admit: 2019-07-29 | Discharge: 2019-07-29 | Disposition: A | Discharge status: Home / Self Care | Payer: Medicare Other | Source: Ambulatory Visit | Attending: Family Medicine | Admitting: Family Medicine

## 2019-07-29 ENCOUNTER — Other Ambulatory Visit: Payer: Self-pay

## 2019-07-29 DIAGNOSIS — Z1231 Encounter for screening mammogram for malignant neoplasm of breast: Secondary | ICD-10-CM | POA: Diagnosis not present

## 2019-09-24 ENCOUNTER — Encounter

## 2019-10-27 DIAGNOSIS — H40013 Open angle with borderline findings, low risk, bilateral: Secondary | ICD-10-CM | POA: Diagnosis not present

## 2019-12-15 ENCOUNTER — Encounter: Payer: Self-pay | Admitting: Certified Nurse Midwife

## 2020-02-05 DIAGNOSIS — Z23 Encounter for immunization: Secondary | ICD-10-CM | POA: Diagnosis not present

## 2020-02-26 DIAGNOSIS — Z23 Encounter for immunization: Secondary | ICD-10-CM | POA: Diagnosis not present

## 2020-05-03 DIAGNOSIS — H40013 Open angle with borderline findings, low risk, bilateral: Secondary | ICD-10-CM | POA: Diagnosis not present

## 2020-05-23 DIAGNOSIS — H16223 Keratoconjunctivitis sicca, not specified as Sjogren's, bilateral: Secondary | ICD-10-CM | POA: Diagnosis not present

## 2020-05-31 ENCOUNTER — Ambulatory Visit
Admission: RE | Admit: 2020-05-31 | Discharge: 2020-05-31 | Disposition: A | Discharge status: Home / Self Care | Payer: Medicare Other | Source: Ambulatory Visit | Attending: Acute Care | Admitting: Acute Care

## 2020-05-31 DIAGNOSIS — Z87891 Personal history of nicotine dependence: Secondary | ICD-10-CM

## 2020-05-31 DIAGNOSIS — I2510 Atherosclerotic heart disease of native coronary artery without angina pectoris: Secondary | ICD-10-CM | POA: Diagnosis not present

## 2020-05-31 DIAGNOSIS — F17210 Nicotine dependence, cigarettes, uncomplicated: Secondary | ICD-10-CM | POA: Diagnosis not present

## 2020-05-31 DIAGNOSIS — J432 Centrilobular emphysema: Secondary | ICD-10-CM | POA: Diagnosis not present

## 2020-05-31 DIAGNOSIS — Z122 Encounter for screening for malignant neoplasm of respiratory organs: Secondary | ICD-10-CM

## 2020-05-31 DIAGNOSIS — I700 Atherosclerosis of aorta: Secondary | ICD-10-CM | POA: Diagnosis not present

## 2020-06-02 NOTE — Progress Notes (Signed)

## 2020-06-03 ENCOUNTER — Telehealth: Payer: Self-pay | Admitting: Acute Care

## 2020-06-03 DIAGNOSIS — F17210 Nicotine dependence, cigarettes, uncomplicated: Secondary | ICD-10-CM

## 2020-06-03 DIAGNOSIS — Z87891 Personal history of nicotine dependence: Secondary | ICD-10-CM

## 2020-06-03 NOTE — Telephone Encounter (Signed)
Pt informed of CT results per Sarah Groce, NP.  PT verbalized understanding.  Copy sent to PCP.  Order placed for 1 yr f/u CT.  

## 2020-06-15 ENCOUNTER — Other Ambulatory Visit: Payer: Self-pay | Admitting: Family Medicine

## 2020-06-15 DIAGNOSIS — Z1231 Encounter for screening mammogram for malignant neoplasm of breast: Secondary | ICD-10-CM

## 2020-07-20 DIAGNOSIS — M25561 Pain in right knee: Secondary | ICD-10-CM | POA: Diagnosis not present

## 2020-07-20 DIAGNOSIS — M545 Low back pain, unspecified: Secondary | ICD-10-CM | POA: Diagnosis not present

## 2020-07-22 DIAGNOSIS — H40013 Open angle with borderline findings, low risk, bilateral: Secondary | ICD-10-CM | POA: Diagnosis not present

## 2020-07-22 DIAGNOSIS — H0288B Meibomian gland dysfunction left eye, upper and lower eyelids: Secondary | ICD-10-CM | POA: Diagnosis not present

## 2020-07-22 DIAGNOSIS — H2513 Age-related nuclear cataract, bilateral: Secondary | ICD-10-CM | POA: Diagnosis not present

## 2020-07-29 ENCOUNTER — Ambulatory Visit
Admission: RE | Admit: 2020-07-29 | Discharge: 2020-07-29 | Disposition: A | Discharge status: Home / Self Care | Payer: Medicare Other | Source: Ambulatory Visit | Attending: Family Medicine | Admitting: Family Medicine

## 2020-07-29 ENCOUNTER — Other Ambulatory Visit: Payer: Self-pay

## 2020-07-29 DIAGNOSIS — Z1231 Encounter for screening mammogram for malignant neoplasm of breast: Secondary | ICD-10-CM

## 2020-08-10 DIAGNOSIS — M7072 Other bursitis of hip, left hip: Secondary | ICD-10-CM | POA: Diagnosis not present

## 2020-09-07 DIAGNOSIS — M7072 Other bursitis of hip, left hip: Secondary | ICD-10-CM | POA: Diagnosis not present

## 2020-09-29 DIAGNOSIS — M5416 Radiculopathy, lumbar region: Secondary | ICD-10-CM | POA: Diagnosis not present

## 2020-09-29 DIAGNOSIS — M7072 Other bursitis of hip, left hip: Secondary | ICD-10-CM | POA: Diagnosis not present

## 2020-09-29 DIAGNOSIS — M6281 Muscle weakness (generalized): Secondary | ICD-10-CM | POA: Diagnosis not present

## 2020-10-03 DIAGNOSIS — M6281 Muscle weakness (generalized): Secondary | ICD-10-CM | POA: Diagnosis not present

## 2020-10-03 DIAGNOSIS — M7072 Other bursitis of hip, left hip: Secondary | ICD-10-CM | POA: Diagnosis not present

## 2020-10-03 DIAGNOSIS — M5416 Radiculopathy, lumbar region: Secondary | ICD-10-CM | POA: Diagnosis not present

## 2020-10-05 DIAGNOSIS — M7072 Other bursitis of hip, left hip: Secondary | ICD-10-CM | POA: Diagnosis not present

## 2020-10-05 DIAGNOSIS — M5416 Radiculopathy, lumbar region: Secondary | ICD-10-CM | POA: Diagnosis not present

## 2020-10-05 DIAGNOSIS — M6281 Muscle weakness (generalized): Secondary | ICD-10-CM | POA: Diagnosis not present

## 2020-10-17 DIAGNOSIS — M5416 Radiculopathy, lumbar region: Secondary | ICD-10-CM | POA: Diagnosis not present

## 2020-10-17 DIAGNOSIS — M7072 Other bursitis of hip, left hip: Secondary | ICD-10-CM | POA: Diagnosis not present

## 2020-10-17 DIAGNOSIS — M6281 Muscle weakness (generalized): Secondary | ICD-10-CM | POA: Diagnosis not present

## 2020-10-20 DIAGNOSIS — M5416 Radiculopathy, lumbar region: Secondary | ICD-10-CM | POA: Diagnosis not present

## 2020-10-20 DIAGNOSIS — M7072 Other bursitis of hip, left hip: Secondary | ICD-10-CM | POA: Diagnosis not present

## 2020-10-20 DIAGNOSIS — M6281 Muscle weakness (generalized): Secondary | ICD-10-CM | POA: Diagnosis not present

## 2020-10-25 DIAGNOSIS — M7072 Other bursitis of hip, left hip: Secondary | ICD-10-CM | POA: Diagnosis not present

## 2020-10-25 DIAGNOSIS — M5416 Radiculopathy, lumbar region: Secondary | ICD-10-CM | POA: Diagnosis not present

## 2020-10-25 DIAGNOSIS — M6281 Muscle weakness (generalized): Secondary | ICD-10-CM | POA: Diagnosis not present

## 2020-12-15 DIAGNOSIS — H40013 Open angle with borderline findings, low risk, bilateral: Secondary | ICD-10-CM | POA: Diagnosis not present

## 2021-06-16 ENCOUNTER — Other Ambulatory Visit: Payer: Self-pay | Admitting: Family Medicine

## 2021-06-16 DIAGNOSIS — Z1231 Encounter for screening mammogram for malignant neoplasm of breast: Secondary | ICD-10-CM

## 2021-06-26 ENCOUNTER — Other Ambulatory Visit: Payer: Self-pay | Admitting: *Deleted

## 2021-06-26 DIAGNOSIS — Z87891 Personal history of nicotine dependence: Secondary | ICD-10-CM

## 2021-06-26 DIAGNOSIS — F17210 Nicotine dependence, cigarettes, uncomplicated: Secondary | ICD-10-CM

## 2021-07-11 DIAGNOSIS — K136 Irritative hyperplasia of oral mucosa: Secondary | ICD-10-CM | POA: Diagnosis not present

## 2021-07-17 DIAGNOSIS — H16223 Keratoconjunctivitis sicca, not specified as Sjogren's, bilateral: Secondary | ICD-10-CM | POA: Diagnosis not present

## 2021-07-17 DIAGNOSIS — H25042 Posterior subcapsular polar age-related cataract, left eye: Secondary | ICD-10-CM | POA: Diagnosis not present

## 2021-07-17 DIAGNOSIS — H40021 Open angle with borderline findings, high risk, right eye: Secondary | ICD-10-CM | POA: Diagnosis not present

## 2021-07-17 DIAGNOSIS — H16141 Punctate keratitis, right eye: Secondary | ICD-10-CM | POA: Diagnosis not present

## 2021-07-17 DIAGNOSIS — H25011 Cortical age-related cataract, right eye: Secondary | ICD-10-CM | POA: Diagnosis not present

## 2021-07-19 ENCOUNTER — Ambulatory Visit
Admission: RE | Admit: 2021-07-19 | Discharge: 2021-07-19 | Disposition: A | Discharge status: Home / Self Care | Payer: Medicare Other | Source: Ambulatory Visit | Attending: Acute Care | Admitting: Acute Care

## 2021-07-19 DIAGNOSIS — F17210 Nicotine dependence, cigarettes, uncomplicated: Secondary | ICD-10-CM

## 2021-07-19 DIAGNOSIS — Z87891 Personal history of nicotine dependence: Secondary | ICD-10-CM

## 2021-07-26 ENCOUNTER — Other Ambulatory Visit: Payer: Self-pay | Admitting: Acute Care

## 2021-07-26 ENCOUNTER — Ambulatory Visit (INDEPENDENT_AMBULATORY_CARE_PROVIDER_SITE_OTHER): Payer: Medicare Other | Admitting: Nurse Practitioner

## 2021-07-26 ENCOUNTER — Other Ambulatory Visit: Payer: Self-pay

## 2021-07-26 ENCOUNTER — Encounter: Payer: Self-pay | Admitting: Nurse Practitioner

## 2021-07-26 VITALS — BP 124/80 | Ht 62.5 in | Wt 137.0 lb

## 2021-07-26 DIAGNOSIS — Z01419 Encounter for gynecological examination (general) (routine) without abnormal findings: Secondary | ICD-10-CM | POA: Diagnosis not present

## 2021-07-26 DIAGNOSIS — Z87891 Personal history of nicotine dependence: Secondary | ICD-10-CM

## 2021-07-26 DIAGNOSIS — M8588 Other specified disorders of bone density and structure, other site: Secondary | ICD-10-CM | POA: Diagnosis not present

## 2021-07-26 DIAGNOSIS — Z780 Asymptomatic menopausal state: Secondary | ICD-10-CM | POA: Diagnosis not present

## 2021-07-26 DIAGNOSIS — F17210 Nicotine dependence, cigarettes, uncomplicated: Secondary | ICD-10-CM

## 2021-07-26 NOTE — Progress Notes (Addendum)
   Teresa Ford 1946/06/07 [id number]   History:  75 y.o. G2P2 presents for breast and pelvic exam. Postmenopausal - no HRT. S/P 1976 TAH for DUB. Normal pap history. HTN, HLD managed by PCP. Smoker 1 ppd. Husband passed 05/2021.   Gynecologic History Patient's last menstrual period was 09/24/1974.   Contraception: status post hysterectomy Sexually active: No  Health Maintenance Last Pap: 2004 Last mammogram: 07/29/2020. Results were: Normal Last colonoscopy: 07/25/2016. Results were: Tubular adenoma, 5-year recall Last Dexa: 03/05/2019. Results were: T-score -1.5 at spine, FRAX 14.% / 5.2%  Past medical history, past surgical history, family history and social history were all reviewed and documented in the EPIC chart. Widowed. 2 children living in [place]. Has great grandchildren.   ROS:  A ROS was performed and pertinent positives and negatives are included.  Exam:  Vitals:   07/26/21 1440  BP: 124/80  Weight: 137 lb (62.1 kg)  Height: 5' 2.5" (1.588 m)   Body mass index is 24.66 kg/m.  General appearance:  Normal Thyroid:  Symmetrical, normal in size, without palpable masses or nodularity. Respiratory  Auscultation:  Clear without wheezing or rhonchi Cardiovascular  Auscultation:  Regular rate, without rubs, murmurs or gallops  Edema/varicosities:  Not grossly evident Abdominal  Soft,nontender, without masses, guarding or rebound.  Liver/spleen:  No organomegaly noted  Hernia:  None appreciated  Skin  Inspection:  Grossly normal Breasts: Examined lying and sitting.   Right: Without masses, retractions, nipple discharge or axillary adenopathy.   Left: Without masses, retractions, nipple discharge or axillary adenopathy. Genitourinary   Inguinal/mons:  Normal without inguinal adenopathy  External genitalia:  Normal appearing vulva with no masses, tenderness, or lesions  BUS/Urethra/Skene's glands:  Normal  Vagina:  Atrophic changes  Cervix:  Absent  Uterus:   Absent  Adnexa/parametria:     Rt: Normal in size, without masses or tenderness.   Lt: Normal in size, without masses or tenderness.  Anus and perineum: Normal  Digital rectal exam: Normal sphincter tone without palpated masses or tenderness  Patient informed chaperone available to be present for breast and pelvic exam. Patient has requested no chaperone to be present. Patient has been advised what will be completed during breast and pelvic exam.   Assessment/Plan:  75 y.o. G2P2 for breast and pelvic exam.   Well female exam with routine gynecological exam - Education provided on SBEs, importance of preventative screenings, current guidelines, high calcium diet, regular exercise, and multivitamin daily.  Labs with PCP.   Postmenopausal - Plan: DG Bone Density. No HRT.   Osteopenia of spine - Plan: DG Bone Density. T- score -1.5 at spine. Recommend repeating DXA now. She will try to schedule with [facility]. Continue calcium and vitamin D supplement and exercise.   Screening for cervical cancer - Normal Pap history. No longer screening per guidelines.   Screening for breast cancer - Normal mammogram history.  Continue annual screenings.  Normal breast exam today.  Screening for colon cancer - 2017 colonoscopy. Is due this month and plans to schedule soon.   Return in 2 years for breast and pelvic exam.   Tamela Gammon DNP, 3:10 PM 07/26/2021

## 2021-08-01 ENCOUNTER — Ambulatory Visit
Admission: RE | Admit: 2021-08-01 | Discharge: 2021-08-01 | Disposition: A | Discharge status: Home / Self Care | Payer: Medicare Other | Source: Ambulatory Visit | Attending: Family Medicine | Admitting: Family Medicine

## 2021-08-01 ENCOUNTER — Ambulatory Visit: Payer: Medicare Other

## 2021-08-01 DIAGNOSIS — Z1231 Encounter for screening mammogram for malignant neoplasm of breast: Secondary | ICD-10-CM | POA: Diagnosis not present

## 2021-09-04 IMAGING — MG DIGITAL SCREENING BILAT W/ TOMO W/ CAD
6 of 12 series · 6 of 36 positions shown · non-contrast
Comparison: Previous exam(s).

CLINICAL DATA: Screening.

EXAM:
DIGITAL SCREENING BILATERAL MAMMOGRAM WITH TOMO AND CAD

[R CC synth-2D]
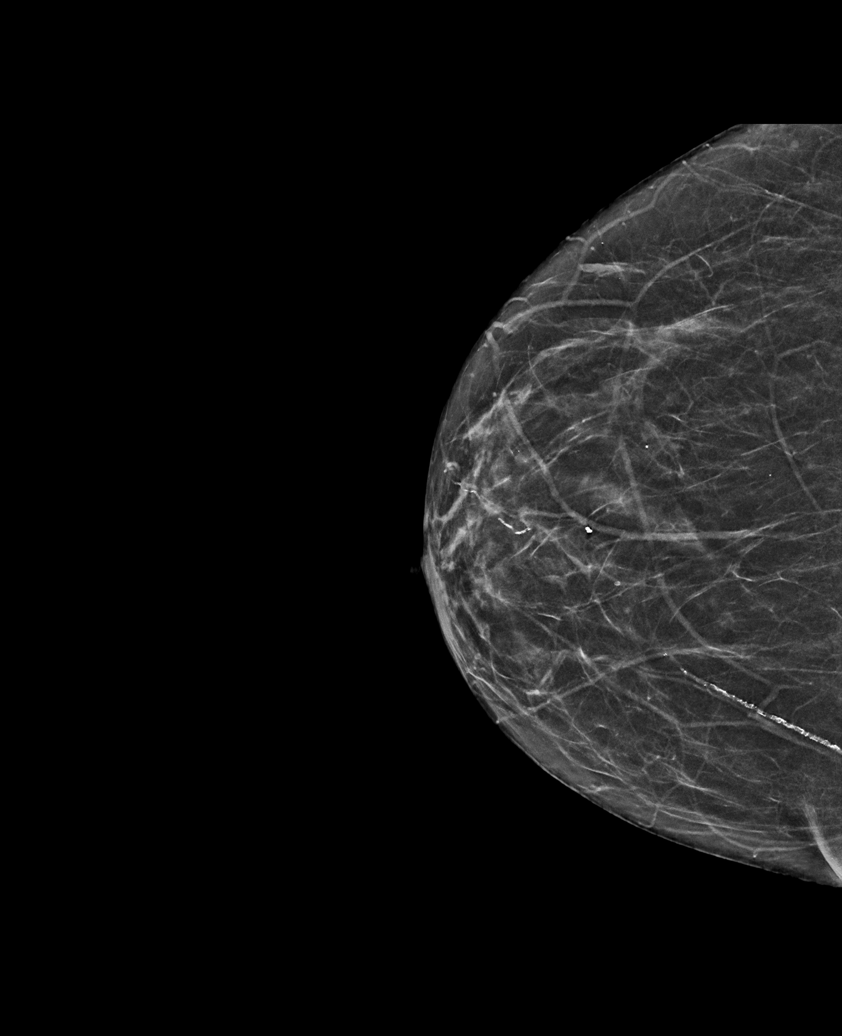

[L CC synth-2D (1 of 3)]
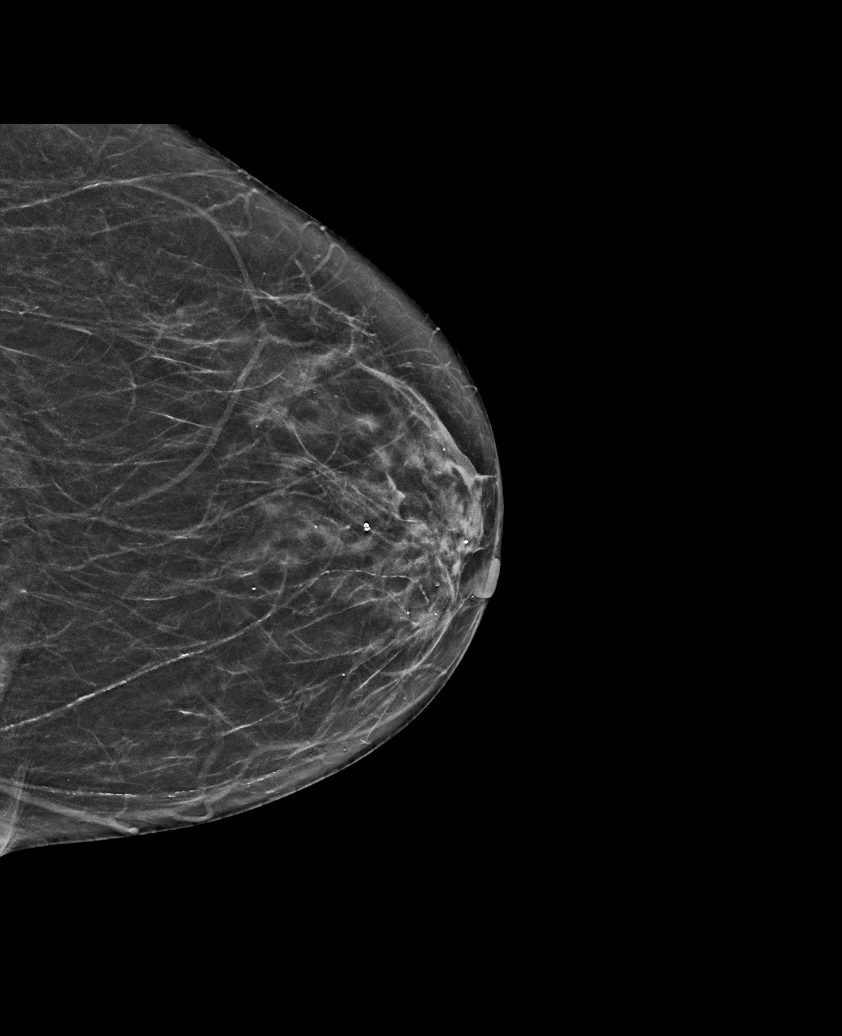

[R MLO synth-2D]
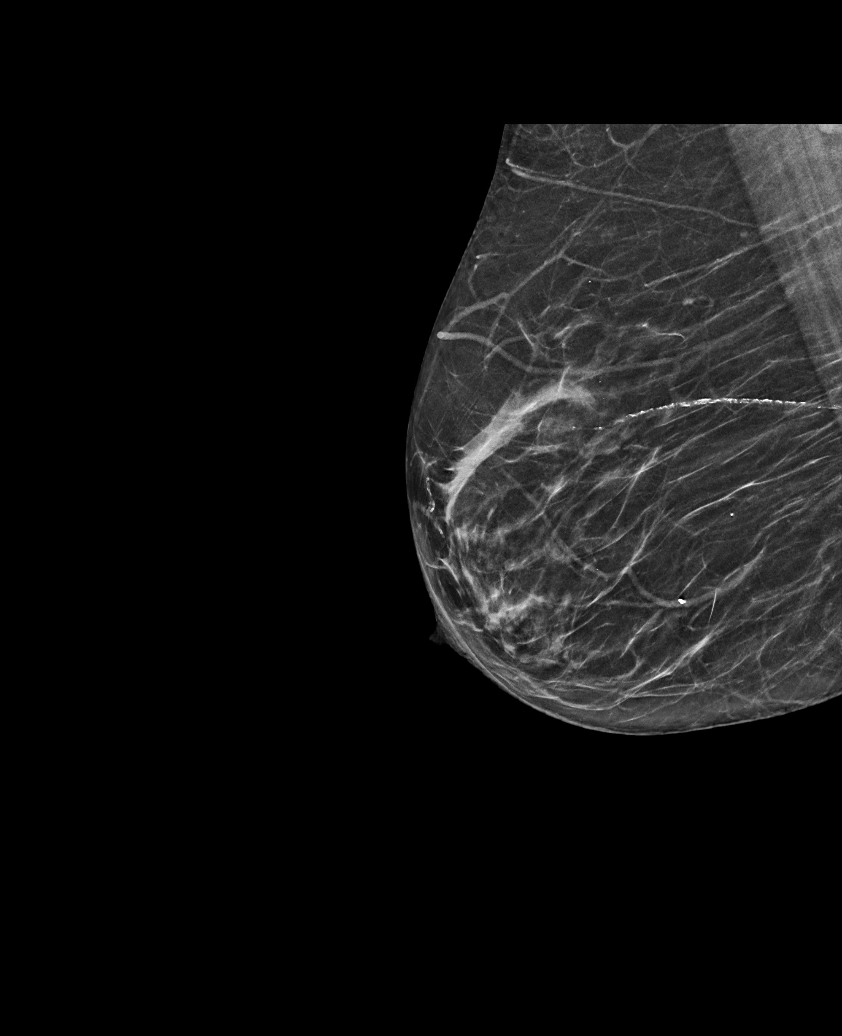

[L CC synth-2D (2 of 3)]
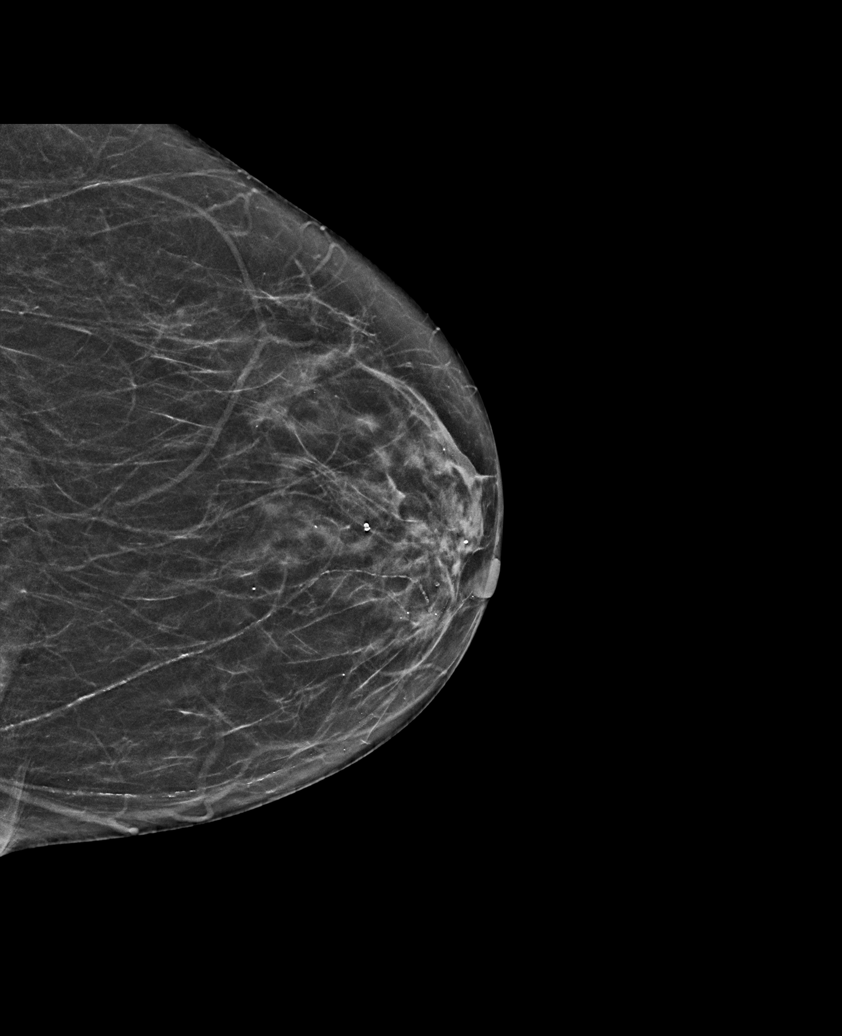

[L CC synth-2D (3 of 3)]
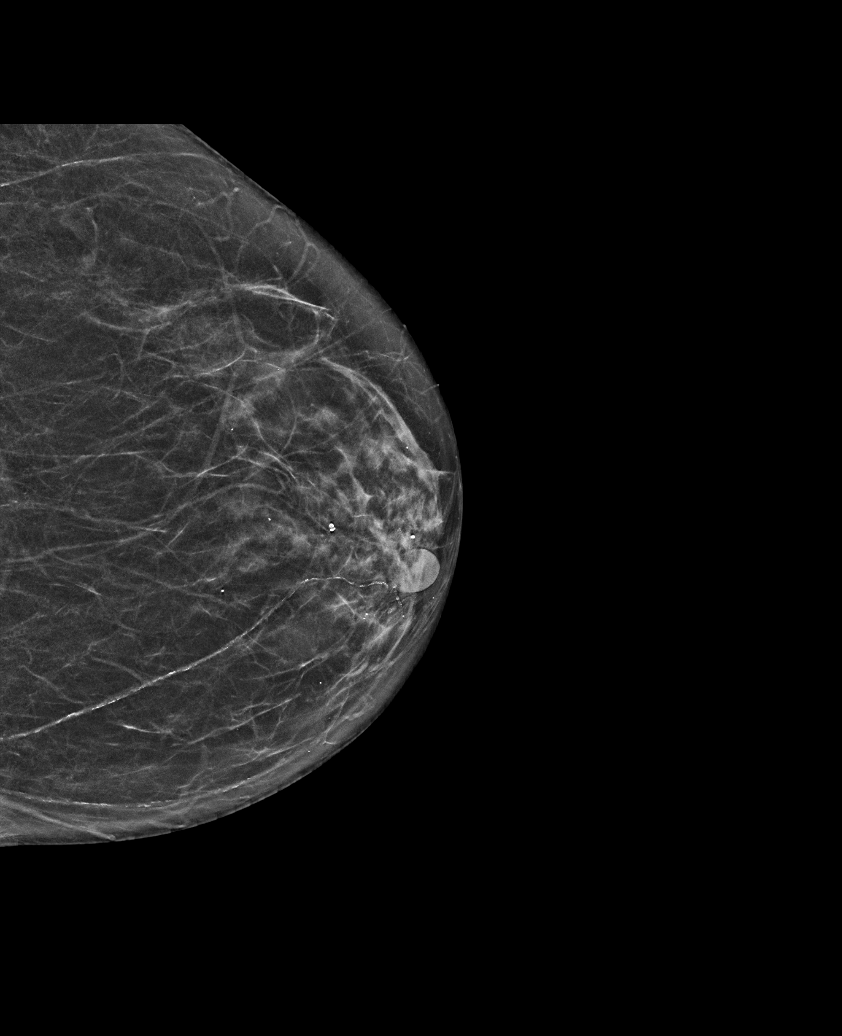

[L MLO synth-2D]
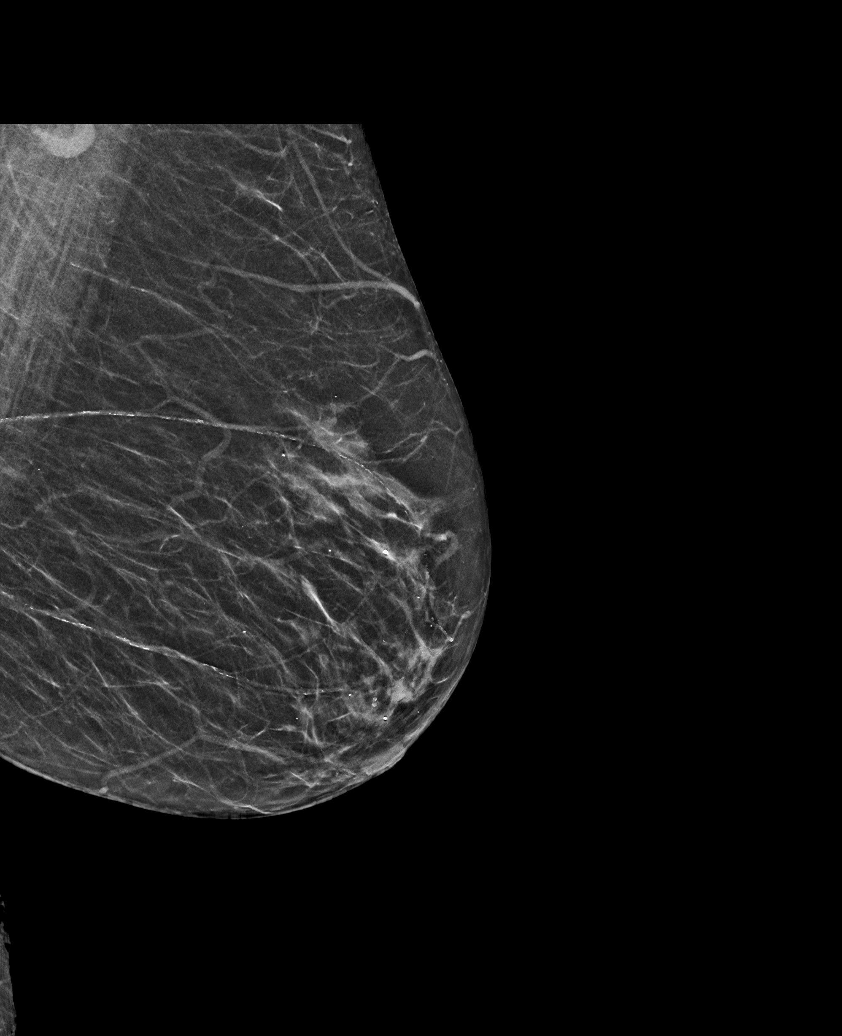

[6 of 36 positions shown; findings below may reference images not displayed]

ACR Breast Density Category b: There are scattered areas of
fibroglandular density.
FINDINGS: There are no findings suspicious for malignancy. Images were
processed with CAD.
IMPRESSION: No mammographic evidence of malignancy. A result letter of this
screening mammogram will be mailed directly to the patient.

RECOMMENDATION:
Screening mammogram in one year. (Code:[id number])

BI-RADS CATEGORY  1: Negative.

## 2021-10-18 DIAGNOSIS — R7303 Prediabetes: Secondary | ICD-10-CM | POA: Diagnosis not present

## 2021-10-18 DIAGNOSIS — I2510 Atherosclerotic heart disease of native coronary artery without angina pectoris: Secondary | ICD-10-CM | POA: Diagnosis not present

## 2021-10-18 DIAGNOSIS — I700 Atherosclerosis of aorta: Secondary | ICD-10-CM | POA: Diagnosis not present

## 2021-10-18 DIAGNOSIS — E7800 Pure hypercholesterolemia, unspecified: Secondary | ICD-10-CM | POA: Diagnosis not present

## 2021-10-18 DIAGNOSIS — R634 Abnormal weight loss: Secondary | ICD-10-CM | POA: Diagnosis not present

## 2021-10-18 DIAGNOSIS — I10 Essential (primary) hypertension: Secondary | ICD-10-CM | POA: Diagnosis not present

## 2021-10-18 DIAGNOSIS — Z79899 Other long term (current) drug therapy: Secondary | ICD-10-CM | POA: Diagnosis not present

## 2021-10-18 DIAGNOSIS — Z0001 Encounter for general adult medical examination with abnormal findings: Secondary | ICD-10-CM | POA: Diagnosis not present

## 2021-10-18 DIAGNOSIS — J432 Centrilobular emphysema: Secondary | ICD-10-CM | POA: Diagnosis not present

## 2021-10-31 ENCOUNTER — Ambulatory Visit (INDEPENDENT_AMBULATORY_CARE_PROVIDER_SITE_OTHER): Payer: Medicare Other

## 2021-10-31 ENCOUNTER — Other Ambulatory Visit: Payer: Self-pay

## 2021-10-31 ENCOUNTER — Other Ambulatory Visit: Payer: Self-pay | Admitting: Nurse Practitioner

## 2021-10-31 DIAGNOSIS — Z780 Asymptomatic menopausal state: Secondary | ICD-10-CM

## 2021-10-31 DIAGNOSIS — M8589 Other specified disorders of bone density and structure, multiple sites: Secondary | ICD-10-CM

## 2021-10-31 DIAGNOSIS — M8588 Other specified disorders of bone density and structure, other site: Secondary | ICD-10-CM

## 2021-12-06 ENCOUNTER — Ambulatory Visit (INDEPENDENT_AMBULATORY_CARE_PROVIDER_SITE_OTHER): Payer: Medicare Other | Admitting: Obstetrics & Gynecology

## 2021-12-06 ENCOUNTER — Encounter: Payer: Self-pay | Admitting: Obstetrics & Gynecology

## 2021-12-06 ENCOUNTER — Other Ambulatory Visit: Payer: Self-pay

## 2021-12-06 VITALS — BP 120/78

## 2021-12-06 DIAGNOSIS — F17210 Nicotine dependence, cigarettes, uncomplicated: Secondary | ICD-10-CM

## 2021-12-06 DIAGNOSIS — M8589 Other specified disorders of bone density and structure, multiple sites: Secondary | ICD-10-CM | POA: Diagnosis not present

## 2021-12-06 NOTE — Progress Notes (Signed)
? ? ?  AYLINN RYDBERG 12-31-1945 [id number] ? ? ?     76 y.o.  G2P2L2  ? ?RP: Counseling on Osteopenia with increased Frax ? ?HPI: Patient is a cigarette smoker.  Smokes 1 pack per day.  Tried quitting without success in the past.  Mother with h/o Osteoporosis.  Good balance.  Taking Ca++1.2 g/d supplement and Vit D 800 IU daily.  On her feet most of the day and going up and down stairs, but not doing fitness activities. Good balance.  No h/o fracture. ? ? ?OB History  ?Gravida Para Term Preterm AB Living  ?'2 2       2  '$ ?SAB IAB Ectopic Multiple Live Births  ?           ?  ?# Outcome Date GA Lbr Len/2nd Weight Sex Delivery Anes PTL Lv  ?2 Para           ?1 Para           ? ? ?Past medical history,surgical history, problem list, medications, allergies, family history and social history were all reviewed and documented in the EPIC chart. ? ? ?Directed ROS with pertinent positives and negatives documented in the history of present illness/assessment and plan. ? ?Exam: ? ?Vitals:  ? 12/06/21 1007  ?BP: 120/78  ? ?General appearance:  Normal ? ?Bone Density:  Osteopenia with lowest T-Score at the Rt total hip with T-Score at -1.2.  Frax increased:  Overall 20% and Hip 12%. ? ? ?Assessment/Plan:  76 y.o. G2P2  ? ?1. Osteopenia of multiple sites ?Osteopenia with lowest T-Score at the Rt total hip with T-Score at -1.2.  Frax increased:  Overall 20% and Hip 12%.Patient is a cigarette smoker.  Smokes 1 pack per day.  Tried quitting without success in the past.  Mother with h/o Osteoporosis.  Good balance.  Taking Ca++1.2 g/d supplement and Vit D 800 IU daily.  On her feet most of the day and going up and down stairs, but not doing fitness activities. Good balance.  No h/o fracture. ?Counseling on Osteopenia and the significance of an increased Frax.  Will continue on Ca++/Vit D supplements.  Increase weight bearing activities.  Decrease the number of cigarettes smoked every day and eventually quit smoking.  Repeat BD in 2  years. ? ?2. Cigarette smoker ?Counseling on decreasing the number of cigarettes smoked every day to eventually quit smoking. ? ?Other orders ?- Lifitegrast (XIIDRA) 5 % SOLN; 1 drop into affected eye  ? ?Counseling on Osteopenia with increased Frax and cessation of cigarette smoking x 30 minutes. ? ?Princess Bruins MD, 10:39 AM 12/06/2021 ? ? ? ?  ?

## 2022-01-31 DIAGNOSIS — K9161 Intraoperative hemorrhage and hematoma of a digestive system organ or structure complicating a digestive sytem procedure: Secondary | ICD-10-CM | POA: Diagnosis not present

## 2022-01-31 DIAGNOSIS — K6289 Other specified diseases of anus and rectum: Secondary | ICD-10-CM | POA: Diagnosis not present

## 2022-01-31 DIAGNOSIS — K648 Other hemorrhoids: Secondary | ICD-10-CM | POA: Diagnosis not present

## 2022-01-31 DIAGNOSIS — K5730 Diverticulosis of large intestine without perforation or abscess without bleeding: Secondary | ICD-10-CM | POA: Diagnosis not present

## 2022-01-31 DIAGNOSIS — Z86010 Personal history of colonic polyps: Secondary | ICD-10-CM | POA: Diagnosis not present

## 2022-01-31 DIAGNOSIS — K6389 Other specified diseases of intestine: Secondary | ICD-10-CM | POA: Diagnosis not present

## 2022-01-31 DIAGNOSIS — Z09 Encounter for follow-up examination after completed treatment for conditions other than malignant neoplasm: Secondary | ICD-10-CM | POA: Diagnosis not present

## 2022-02-09 DIAGNOSIS — H40021 Open angle with borderline findings, high risk, right eye: Secondary | ICD-10-CM | POA: Diagnosis not present

## 2022-02-26 DIAGNOSIS — K580 Irritable bowel syndrome with diarrhea: Secondary | ICD-10-CM | POA: Diagnosis not present

## 2022-02-26 DIAGNOSIS — K641 Second degree hemorrhoids: Secondary | ICD-10-CM | POA: Diagnosis not present

## 2022-02-26 DIAGNOSIS — K6289 Other specified diseases of anus and rectum: Secondary | ICD-10-CM | POA: Diagnosis not present

## 2022-02-26 DIAGNOSIS — Z720 Tobacco use: Secondary | ICD-10-CM | POA: Diagnosis not present

## 2022-02-26 DIAGNOSIS — J449 Chronic obstructive pulmonary disease, unspecified: Secondary | ICD-10-CM | POA: Diagnosis not present

## 2022-05-14 DIAGNOSIS — H401111 Primary open-angle glaucoma, right eye, mild stage: Secondary | ICD-10-CM | POA: Diagnosis not present

## 2022-06-22 DIAGNOSIS — H401111 Primary open-angle glaucoma, right eye, mild stage: Secondary | ICD-10-CM | POA: Diagnosis not present

## 2022-06-28 ENCOUNTER — Other Ambulatory Visit: Payer: Self-pay | Admitting: Family Medicine

## 2022-06-28 DIAGNOSIS — Z1231 Encounter for screening mammogram for malignant neoplasm of breast: Secondary | ICD-10-CM

## 2022-07-19 ENCOUNTER — Ambulatory Visit
Admission: RE | Admit: 2022-07-19 | Discharge: 2022-07-19 | Disposition: A | Discharge status: Home / Self Care | Payer: Medicare Other | Source: Ambulatory Visit | Attending: Family Medicine | Admitting: Family Medicine

## 2022-07-19 DIAGNOSIS — Z87891 Personal history of nicotine dependence: Secondary | ICD-10-CM

## 2022-07-19 DIAGNOSIS — F17210 Nicotine dependence, cigarettes, uncomplicated: Secondary | ICD-10-CM

## 2022-07-23 ENCOUNTER — Other Ambulatory Visit: Payer: Self-pay | Admitting: Acute Care

## 2022-07-23 DIAGNOSIS — Z87891 Personal history of nicotine dependence: Secondary | ICD-10-CM

## 2022-07-23 DIAGNOSIS — F17210 Nicotine dependence, cigarettes, uncomplicated: Secondary | ICD-10-CM

## 2022-07-23 DIAGNOSIS — Z122 Encounter for screening for malignant neoplasm of respiratory organs: Secondary | ICD-10-CM

## 2022-07-30 DIAGNOSIS — K649 Unspecified hemorrhoids: Secondary | ICD-10-CM | POA: Diagnosis not present

## 2022-08-02 ENCOUNTER — Ambulatory Visit: Payer: Medicare Other

## 2022-08-27 DIAGNOSIS — H401111 Primary open-angle glaucoma, right eye, mild stage: Secondary | ICD-10-CM | POA: Diagnosis not present

## 2022-09-05 ENCOUNTER — Ambulatory Visit
Admission: RE | Admit: 2022-09-05 | Discharge: 2022-09-05 | Disposition: A | Discharge status: Home / Self Care | Payer: Medicare Other | Source: Ambulatory Visit | Attending: Family Medicine | Admitting: Family Medicine

## 2022-09-05 DIAGNOSIS — Z1231 Encounter for screening mammogram for malignant neoplasm of breast: Secondary | ICD-10-CM

## 2022-09-07 ENCOUNTER — Other Ambulatory Visit: Payer: Self-pay | Admitting: Family Medicine

## 2022-09-07 DIAGNOSIS — R928 Other abnormal and inconclusive findings on diagnostic imaging of breast: Secondary | ICD-10-CM

## 2022-10-09 ENCOUNTER — Other Ambulatory Visit: Payer: Medicare Other

## 2022-10-23 ENCOUNTER — Ambulatory Visit
Admission: RE | Admit: 2022-10-23 | Discharge: 2022-10-23 | Disposition: A | Discharge status: Home / Self Care | Payer: Medicare Other | Source: Ambulatory Visit | Attending: Family Medicine | Admitting: Family Medicine

## 2022-10-23 ENCOUNTER — Ambulatory Visit: Payer: Medicare Other

## 2022-10-23 DIAGNOSIS — R928 Other abnormal and inconclusive findings on diagnostic imaging of breast: Secondary | ICD-10-CM

## 2022-10-24 DIAGNOSIS — Z23 Encounter for immunization: Secondary | ICD-10-CM | POA: Diagnosis not present

## 2022-10-24 DIAGNOSIS — I10 Essential (primary) hypertension: Secondary | ICD-10-CM | POA: Diagnosis not present

## 2022-10-24 DIAGNOSIS — Z0001 Encounter for general adult medical examination with abnormal findings: Secondary | ICD-10-CM | POA: Diagnosis not present

## 2022-10-24 DIAGNOSIS — I2510 Atherosclerotic heart disease of native coronary artery without angina pectoris: Secondary | ICD-10-CM | POA: Diagnosis not present

## 2022-10-24 DIAGNOSIS — E7800 Pure hypercholesterolemia, unspecified: Secondary | ICD-10-CM | POA: Diagnosis not present

## 2022-10-24 DIAGNOSIS — R7303 Prediabetes: Secondary | ICD-10-CM | POA: Diagnosis not present

## 2022-10-24 DIAGNOSIS — Z79899 Other long term (current) drug therapy: Secondary | ICD-10-CM | POA: Diagnosis not present

## 2022-10-24 DIAGNOSIS — I700 Atherosclerosis of aorta: Secondary | ICD-10-CM | POA: Diagnosis not present

## 2022-10-24 DIAGNOSIS — J432 Centrilobular emphysema: Secondary | ICD-10-CM | POA: Diagnosis not present

## 2022-10-24 DIAGNOSIS — E559 Vitamin D deficiency, unspecified: Secondary | ICD-10-CM | POA: Diagnosis not present

## 2022-10-31 DIAGNOSIS — M545 Low back pain, unspecified: Secondary | ICD-10-CM | POA: Diagnosis not present

## 2022-11-09 DIAGNOSIS — M545 Low back pain, unspecified: Secondary | ICD-10-CM | POA: Diagnosis not present

## 2022-11-14 DIAGNOSIS — M48062 Spinal stenosis, lumbar region with neurogenic claudication: Secondary | ICD-10-CM | POA: Diagnosis not present

## 2022-11-19 DIAGNOSIS — Z716 Tobacco abuse counseling: Secondary | ICD-10-CM | POA: Diagnosis not present

## 2022-11-19 DIAGNOSIS — F17210 Nicotine dependence, cigarettes, uncomplicated: Secondary | ICD-10-CM | POA: Diagnosis not present

## 2022-11-19 DIAGNOSIS — M48062 Spinal stenosis, lumbar region with neurogenic claudication: Secondary | ICD-10-CM | POA: Diagnosis not present

## 2022-11-26 DIAGNOSIS — H401111 Primary open-angle glaucoma, right eye, mild stage: Secondary | ICD-10-CM | POA: Diagnosis not present

## 2022-11-26 DIAGNOSIS — H25013 Cortical age-related cataract, bilateral: Secondary | ICD-10-CM | POA: Diagnosis not present

## 2022-11-26 DIAGNOSIS — H43813 Vitreous degeneration, bilateral: Secondary | ICD-10-CM | POA: Diagnosis not present

## 2022-11-28 DIAGNOSIS — M48062 Spinal stenosis, lumbar region with neurogenic claudication: Secondary | ICD-10-CM | POA: Diagnosis not present

## 2022-12-25 DIAGNOSIS — M48061 Spinal stenosis, lumbar region without neurogenic claudication: Secondary | ICD-10-CM | POA: Diagnosis not present

## 2023-01-15 DIAGNOSIS — R269 Unspecified abnormalities of gait and mobility: Secondary | ICD-10-CM | POA: Diagnosis not present

## 2023-01-15 DIAGNOSIS — R292 Abnormal reflex: Secondary | ICD-10-CM | POA: Diagnosis not present

## 2023-01-15 DIAGNOSIS — Z6825 Body mass index (BMI) 25.0-25.9, adult: Secondary | ICD-10-CM | POA: Diagnosis not present

## 2023-01-15 DIAGNOSIS — M4316 Spondylolisthesis, lumbar region: Secondary | ICD-10-CM | POA: Diagnosis not present

## 2023-01-29 DIAGNOSIS — M542 Cervicalgia: Secondary | ICD-10-CM | POA: Diagnosis not present

## 2023-01-29 DIAGNOSIS — R292 Abnormal reflex: Secondary | ICD-10-CM | POA: Diagnosis not present

## 2023-02-04 ENCOUNTER — Emergency Department (HOSPITAL_COMMUNITY): Payer: Medicare Other

## 2023-02-04 ENCOUNTER — Emergency Department (HOSPITAL_COMMUNITY): Payer: Medicare Other | Admitting: Certified Registered Nurse Anesthetist

## 2023-02-04 ENCOUNTER — Inpatient Hospital Stay (HOSPITAL_COMMUNITY)
Admission: EM | Admit: 2023-02-04 | Discharge: 2023-02-08 | DRG: 023 | Disposition: A | Discharge status: Home / Self Care | Payer: Medicare Other | Attending: Neurology | Admitting: Neurology

## 2023-02-04 ENCOUNTER — Encounter (HOSPITAL_COMMUNITY)
Admission: EM | Disposition: A | Discharge status: Home / Self Care | Payer: Self-pay | Source: Home / Self Care | Attending: Neurology

## 2023-02-04 DIAGNOSIS — Z801 Family history of malignant neoplasm of trachea, bronchus and lung: Secondary | ICD-10-CM

## 2023-02-04 DIAGNOSIS — F32A Depression, unspecified: Secondary | ICD-10-CM | POA: Diagnosis present

## 2023-02-04 DIAGNOSIS — F17200 Nicotine dependence, unspecified, uncomplicated: Secondary | ICD-10-CM | POA: Diagnosis present

## 2023-02-04 DIAGNOSIS — I4891 Unspecified atrial fibrillation: Secondary | ICD-10-CM | POA: Diagnosis present

## 2023-02-04 DIAGNOSIS — E785 Hyperlipidemia, unspecified: Secondary | ICD-10-CM | POA: Diagnosis present

## 2023-02-04 DIAGNOSIS — F411 Generalized anxiety disorder: Secondary | ICD-10-CM | POA: Diagnosis present

## 2023-02-04 DIAGNOSIS — Z888 Allergy status to other drugs, medicaments and biological substances status: Secondary | ICD-10-CM | POA: Diagnosis not present

## 2023-02-04 DIAGNOSIS — R471 Dysarthria and anarthria: Secondary | ICD-10-CM | POA: Diagnosis present

## 2023-02-04 DIAGNOSIS — G8191 Hemiplegia, unspecified affecting right dominant side: Secondary | ICD-10-CM | POA: Diagnosis present

## 2023-02-04 DIAGNOSIS — I609 Nontraumatic subarachnoid hemorrhage, unspecified: Secondary | ICD-10-CM | POA: Diagnosis present

## 2023-02-04 DIAGNOSIS — M8580 Other specified disorders of bone density and structure, unspecified site: Secondary | ICD-10-CM | POA: Diagnosis present

## 2023-02-04 DIAGNOSIS — J449 Chronic obstructive pulmonary disease, unspecified: Secondary | ICD-10-CM

## 2023-02-04 DIAGNOSIS — I252 Old myocardial infarction: Secondary | ICD-10-CM

## 2023-02-04 DIAGNOSIS — Z833 Family history of diabetes mellitus: Secondary | ICD-10-CM | POA: Diagnosis not present

## 2023-02-04 DIAGNOSIS — Z8262 Family history of osteoporosis: Secondary | ICD-10-CM | POA: Diagnosis not present

## 2023-02-04 DIAGNOSIS — I6389 Other cerebral infarction: Secondary | ICD-10-CM | POA: Diagnosis not present

## 2023-02-04 DIAGNOSIS — Z7982 Long term (current) use of aspirin: Secondary | ICD-10-CM | POA: Diagnosis not present

## 2023-02-04 DIAGNOSIS — I6523 Occlusion and stenosis of bilateral carotid arteries: Secondary | ICD-10-CM | POA: Diagnosis not present

## 2023-02-04 DIAGNOSIS — I480 Paroxysmal atrial fibrillation: Secondary | ICD-10-CM | POA: Diagnosis not present

## 2023-02-04 DIAGNOSIS — Z79899 Other long term (current) drug therapy: Secondary | ICD-10-CM

## 2023-02-04 DIAGNOSIS — I10 Essential (primary) hypertension: Secondary | ICD-10-CM

## 2023-02-04 DIAGNOSIS — R29718 NIHSS score 18: Secondary | ICD-10-CM | POA: Diagnosis present

## 2023-02-04 DIAGNOSIS — Z8249 Family history of ischemic heart disease and other diseases of the circulatory system: Secondary | ICD-10-CM | POA: Diagnosis not present

## 2023-02-04 DIAGNOSIS — R7303 Prediabetes: Secondary | ICD-10-CM | POA: Diagnosis present

## 2023-02-04 DIAGNOSIS — I639 Cerebral infarction, unspecified: Secondary | ICD-10-CM | POA: Diagnosis not present

## 2023-02-04 DIAGNOSIS — Y905 Blood alcohol level of 100-119 mg/100 ml: Secondary | ICD-10-CM | POA: Diagnosis present

## 2023-02-04 DIAGNOSIS — R4701 Aphasia: Secondary | ICD-10-CM | POA: Diagnosis present

## 2023-02-04 DIAGNOSIS — F17210 Nicotine dependence, cigarettes, uncomplicated: Secondary | ICD-10-CM | POA: Diagnosis present

## 2023-02-04 DIAGNOSIS — R404 Transient alteration of awareness: Secondary | ICD-10-CM | POA: Diagnosis not present

## 2023-02-04 DIAGNOSIS — R29810 Facial weakness: Secondary | ICD-10-CM | POA: Diagnosis present

## 2023-02-04 DIAGNOSIS — I6602 Occlusion and stenosis of left middle cerebral artery: Secondary | ICD-10-CM | POA: Diagnosis present

## 2023-02-04 DIAGNOSIS — E876 Hypokalemia: Secondary | ICD-10-CM | POA: Diagnosis present

## 2023-02-04 DIAGNOSIS — I63512 Cerebral infarction due to unspecified occlusion or stenosis of left middle cerebral artery: Secondary | ICD-10-CM | POA: Diagnosis present

## 2023-02-04 DIAGNOSIS — Z87891 Personal history of nicotine dependence: Secondary | ICD-10-CM | POA: Diagnosis not present

## 2023-02-04 DIAGNOSIS — M1990 Unspecified osteoarthritis, unspecified site: Secondary | ICD-10-CM | POA: Diagnosis present

## 2023-02-04 DIAGNOSIS — I63412 Cerebral infarction due to embolism of left middle cerebral artery: Secondary | ICD-10-CM | POA: Diagnosis not present

## 2023-02-04 DIAGNOSIS — R531 Weakness: Secondary | ICD-10-CM | POA: Diagnosis not present

## 2023-02-04 DIAGNOSIS — R29818 Other symptoms and signs involving the nervous system: Secondary | ICD-10-CM | POA: Diagnosis not present

## 2023-02-04 DIAGNOSIS — J441 Chronic obstructive pulmonary disease with (acute) exacerbation: Secondary | ICD-10-CM | POA: Diagnosis present

## 2023-02-04 DIAGNOSIS — Z9889 Other specified postprocedural states: Secondary | ICD-10-CM

## 2023-02-04 HISTORY — PX: RADIOLOGY WITH ANESTHESIA: SHX6223

## 2023-02-04 HISTORY — PX: IR PERCUTANEOUS ART THROMBECTOMY/INFUSION INTRACRANIAL INC DIAG ANGIO: IMG6087

## 2023-02-04 HISTORY — PX: IR CT HEAD LTD: IMG2386

## 2023-02-04 LAB — APTT: aPTT: 30 seconds (ref 24–36)

## 2023-02-04 LAB — DIFFERENTIAL
Abs Immature Granulocytes: 0.01 10*3/uL (ref 0.00–0.07)
Basophils Absolute: 0.1 10*3/uL (ref 0.0–0.1)
Basophils Relative: 1 %
Eosinophils Absolute: 0.2 10*3/uL (ref 0.0–0.5)
Eosinophils Relative: 4 %
Immature Granulocytes: 0 %
Lymphocytes Relative: 31 %
Lymphs Abs: 1.8 10*3/uL (ref 0.7–4.0)
Monocytes Absolute: 0.4 10*3/uL (ref 0.1–1.0)
Monocytes Relative: 7 %
Neutro Abs: 3.3 10*3/uL (ref 1.7–7.7)
Neutrophils Relative %: 57 %

## 2023-02-04 LAB — COMPREHENSIVE METABOLIC PANEL
ALT: 14 U/L (ref 0–44)
AST: 24 U/L (ref 15–41)
Albumin: 2.8 g/dL — ABNORMAL LOW (ref 3.5–5.0)
Alkaline Phosphatase: 43 U/L (ref 38–126)
Anion gap: 11 (ref 5–15)
BUN: 21 mg/dL (ref 8–23)
CO2: 21 mmol/L — ABNORMAL LOW (ref 22–32)
Calcium: 7.8 mg/dL — ABNORMAL LOW (ref 8.9–10.3)
Chloride: 105 mmol/L (ref 98–111)
Creatinine, Ser: 0.87 mg/dL (ref 0.44–1.00)
GFR, Estimated: >60 mL/min (ref >60)
Glucose, Bld: 117 mg/dL — ABNORMAL HIGH (ref 70–99)
Potassium: 2.8 mmol/L — ABNORMAL LOW (ref 3.5–5.1)
Sodium: 137 mmol/L (ref 135–145)
Total Bilirubin: 0.4 mg/dL (ref 0.3–1.2)
Total Protein: 4.8 g/dL — ABNORMAL LOW (ref 6.5–8.1)

## 2023-02-04 LAB — CBC
HCT: 35 % — ABNORMAL LOW (ref 36.0–46.0)
Hemoglobin: 12.1 g/dL (ref 12.0–15.0)
MCH: 35 pg — ABNORMAL HIGH (ref 26.0–34.0)
MCHC: 34.6 g/dL (ref 30.0–36.0)
MCV: 101.2 fL — ABNORMAL HIGH (ref 80.0–100.0)
Platelets: 217 10*3/uL (ref 150–400)
RBC: 3.46 MIL/uL — ABNORMAL LOW (ref 3.87–5.11)
RDW: 12 % (ref 11.5–15.5)
WBC: 5.8 10*3/uL (ref 4.0–10.5)
nRBC: 0 % (ref 0.0–0.2)

## 2023-02-04 LAB — CBG MONITORING, ED: Glucose-Capillary: 110 mg/dL — ABNORMAL HIGH (ref 70–99)

## 2023-02-04 LAB — I-STAT CHEM 8, ED
BUN: 22 mg/dL (ref 8–23)
Calcium, Ion: 1.08 mmol/L — ABNORMAL LOW (ref 1.15–1.40)
Chloride: 104 mmol/L (ref 98–111)
Creatinine, Ser: 1 mg/dL (ref 0.44–1.00)
Glucose, Bld: 112 mg/dL — ABNORMAL HIGH (ref 70–99)
HCT: 34 % — ABNORMAL LOW (ref 36.0–46.0)
Hemoglobin: 11.6 g/dL — ABNORMAL LOW (ref 12.0–15.0)
Potassium: 2.9 mmol/L — ABNORMAL LOW (ref 3.5–5.1)
Sodium: 140 mmol/L (ref 135–145)
TCO2: 22 mmol/L (ref 22–32)

## 2023-02-04 LAB — ETHANOL: Alcohol, Ethyl (B): 100 mg/dL — ABNORMAL HIGH (ref <10)

## 2023-02-04 LAB — PROTIME-INR
INR: 1 (ref 0.8–1.2)
Prothrombin Time: 13.2 seconds (ref 11.4–15.2)

## 2023-02-04 SURGERY — RADIOLOGY WITH ANESTHESIA
Anesthesia: General

## 2023-02-04 MED ORDER — ACETAMINOPHEN 325 MG PO TABS: 650.0000 mg | ORAL_TABLET | ORAL | Status: DC | PRN

## 2023-02-04 MED ORDER — ACETAMINOPHEN 160 MG/5ML PO SOLN: 650.0000 mg | ORAL | Status: DC | PRN

## 2023-02-04 MED ORDER — FENTANYL CITRATE (PF) 100 MCG/2ML IJ SOLN
INTRAMUSCULAR | Status: AC
  Filled 2023-02-04: qty 2

## 2023-02-04 MED ORDER — ACETAMINOPHEN 650 MG RE SUPP: 650.0000 mg | RECTAL | Status: DC | PRN

## 2023-02-04 MED ORDER — LIDOCAINE 2% (20 MG/ML) 5 ML SYRINGE
INTRAMUSCULAR | Status: DC | PRN
  Administered 2023-02-04: 60 mg via INTRAVENOUS

## 2023-02-04 MED ORDER — SODIUM CHLORIDE 0.9 % IV SOLN: INTRAVENOUS | Status: DC

## 2023-02-04 MED ORDER — IOHEXOL 300 MG/ML  SOLN
150.0000 mL | Freq: Once | INTRAMUSCULAR | Status: AC | PRN
  Administered 2023-02-04: 75 mL via INTRA_ARTERIAL

## 2023-02-04 MED ORDER — CLEVIDIPINE BUTYRATE 0.5 MG/ML IV EMUL: 0.0000 mg/h | INTRAVENOUS | Status: AC

## 2023-02-04 MED ORDER — STROKE: EARLY STAGES OF RECOVERY BOOK
Freq: Once | Status: AC
  Administered 2023-02-05: 1
  Filled 2023-02-04: qty 1

## 2023-02-04 MED ORDER — CLEVIDIPINE BUTYRATE 0.5 MG/ML IV EMUL: 0.0000 mg/h | INTRAVENOUS | Status: DC

## 2023-02-04 MED ORDER — PANTOPRAZOLE SODIUM 40 MG IV SOLR: 40.0000 mg | Freq: Every day | INTRAVENOUS | Status: DC

## 2023-02-04 MED ORDER — LACTATED RINGERS IV SOLN: INTRAVENOUS | Status: DC | PRN

## 2023-02-04 MED ORDER — PROPOFOL 10 MG/ML IV BOLUS
INTRAVENOUS | Status: DC | PRN
  Administered 2023-02-04: 100 mg via INTRAVENOUS

## 2023-02-04 MED ORDER — FENTANYL CITRATE (PF) 100 MCG/2ML IJ SOLN
INTRAMUSCULAR | Status: DC | PRN
  Administered 2023-02-04: 100 ug via INTRAVENOUS

## 2023-02-04 MED ORDER — TENECTEPLASE FOR STROKE
0.2500 mg/kg | PACK | Freq: Once | INTRAVENOUS | Status: AC
  Administered 2023-02-04: 16 mg via INTRAVENOUS
  Filled 2023-02-04: qty 10

## 2023-02-04 MED ORDER — DEXAMETHASONE SODIUM PHOSPHATE 10 MG/ML IJ SOLN
INTRAMUSCULAR | Status: DC | PRN
  Administered 2023-02-04: 5 mg via INTRAVENOUS

## 2023-02-04 MED ORDER — NITROGLYCERIN 1 MG/10 ML FOR IR/CATH LAB
INTRA_ARTERIAL | Status: AC
  Filled 2023-02-04: qty 10

## 2023-02-04 MED ORDER — PHENYLEPHRINE 80 MCG/ML (10ML) SYRINGE FOR IV PUSH (FOR BLOOD PRESSURE SUPPORT)
PREFILLED_SYRINGE | INTRAVENOUS | Status: DC | PRN
  Administered 2023-02-04 (×3): 160 ug via INTRAVENOUS

## 2023-02-04 MED ORDER — ROCURONIUM BROMIDE 10 MG/ML (PF) SYRINGE
PREFILLED_SYRINGE | INTRAVENOUS | Status: DC | PRN
  Administered 2023-02-04: 50 mg via INTRAVENOUS

## 2023-02-04 MED ORDER — ONDANSETRON HCL 4 MG/2ML IJ SOLN
4.0000 mg | Freq: Once | INTRAMUSCULAR | Status: AC
  Administered 2023-02-04: 4 mg via INTRAVENOUS

## 2023-02-04 MED ORDER — SODIUM CHLORIDE 0.9% FLUSH: 3.0000 mL | Freq: Once | INTRAVENOUS | Status: DC

## 2023-02-04 MED ORDER — IOHEXOL 350 MG/ML SOLN
125.0000 mL | Freq: Once | INTRAVENOUS | Status: AC | PRN
  Administered 2023-02-04: 125 mL via INTRAVENOUS

## 2023-02-04 MED ORDER — PHENYLEPHRINE HCL-NACL 20-0.9 MG/250ML-% IV SOLN
INTRAVENOUS | Status: DC | PRN
  Administered 2023-02-04: 60 ug/min via INTRAVENOUS

## 2023-02-04 MED ORDER — SENNOSIDES-DOCUSATE SODIUM 8.6-50 MG PO TABS: 1.0000 | ORAL_TABLET | Freq: Every evening | ORAL | Status: DC | PRN

## 2023-02-04 MED ORDER — SUCCINYLCHOLINE CHLORIDE 200 MG/10ML IV SOSY
PREFILLED_SYRINGE | INTRAVENOUS | Status: DC | PRN
  Administered 2023-02-04: 100 mg via INTRAVENOUS

## 2023-02-04 NOTE — H&P (Signed)
NEUROHOSPITALIST ADMISSION HISTORY AND PHYSICAL   Requesting physician: Dr. Posey Rea  Reason for Consult: Acute onset of right sided weakness and aphasia  History obtained from:  EMS and Chart     HPI:                                                                                                                                          Teresa Ford is an 77 y.o. female with a PMHx of arthritis, anxiety, COPD, depression, HLD, HTN, prediabetes and tobacco abuse who presents to the ED from home via EMS as a Code Stroke after acute onset of right sided weakness and aphasia while at home with family this evening. LKN was 8:00 PM when she was watching TV with her family. Family left the room and when they came back she was on the floor and unable to speak. Family helped her back up to the couch and then called EMS. Per EMS she is not on any blood thinners at home.   Past Medical History:  Diagnosis Date   Anxiety    Arthritis    COPD (chronic obstructive pulmonary disease) ([facility])    Depression    Hyperlipidemia    Hypertension    Mixed incontinence 2010   Osteopenia    Overweight    Prediabetes    Renal cyst    left   Tobacco abuse     Past Surgical History:  Procedure Laterality Date   BREAST EXCISIONAL BIOPSY Right 1972   benign   BREAST SURGERY  1972   benign tumor removed rt breast   BUNIONECTOMY  12/2002   VAGINAL HYSTERECTOMY  1976    Family History  Problem Relation Age of Onset   Heart disease Mother    Dementia Mother    Osteoporosis Mother    Diabetes Father    Heart attack Father    Cancer Sister        lung cancer             Social History:  reports that she has been smoking cigarettes. She has a 40.00 pack-year smoking history. She has never used smokeless tobacco. She reports current alcohol use of about 3.0 - 4.0 standard drinks of alcohol per week. She reports that she does not use drugs.  Allergies  Allergen Reactions    Lamisil [Terbinafine] Rash    HOME MEDICATIONS:  No current facility-administered medications on file prior to encounter.   Current Outpatient Medications on File Prior to Encounter  Medication Sig Dispense Refill   aspirin 81 MG tablet Take 81 mg by mouth daily.     Calcium Carbonate-Vitamin D (CALCIUM 600/VITAMIN D PO) Take 2 tablets by mouth daily.     hydrochlorothiazide (HYDRODIURIL) 25 MG tablet      INCRUSE ELLIPTA 62.5 MCG/INH AEPB      Lifitegrast (XIIDRA) 5 % SOLN 1 drop into affected eye     loratadine (CLARITIN) 10 MG tablet Take 10 mg by mouth daily.     losartan (COZAAR) 50 MG tablet      Multiple Vitamins-Minerals (CENTRUM SILVER PO) Take by mouth daily.     Omega-3 Fatty Acids (FISH OIL PO) Take by mouth.     rosuvastatin (CRESTOR) 40 MG tablet Take 40 mg by mouth daily.     VITAMIN D PO Take by mouth.     vitamin E 180 MG (400 UNITS) capsule Take 400 Units by mouth daily.       ROS:                                                                                                                                       Unable to obtain due to aphasia.    Weight 64.2 kg, last menstrual period 09/24/1974.   General Examination:                                                                                                       Physical Exam  HEENT-  [place]/AT   Lungs- Respirations unlabored Extremities- No edema   Neurological Examination Mental Status: Awake and alert. Mild right hemineglect. Speech severely dysarthric with severe expressive aphasia and moderate receptive aphasia. Unable to answer any orientation questions.  Cranial Nerves: II: Right sided visual field cut. Pupils equal.  III,IV, VI: Left sided gaze preference but will cross midline to the right when tracking V: Decreased responses to right sided tactile stimuli VII: Right facial  droop VIII: Hearing intact to voice IX,X: Gag reflex deferred XI: Head preferentially rotates to the left, but can turn to the right XII: Tongue deviates to the right when protruded Motor: RUE with increased flexor tone and 0/5 strength to commands and noxious stimuli RLE 1-2/5 strength to commands and noxious stimuli LUE 5/5 LLE 5/5 Sensory: Will react to right sided noxious stimuli after a delay Deep Tendon Reflexes: Symmetric Plantars: Upgoing bilaterally  Cerebellar: No gross ataxia to LUE. Unable to perform FNF on the right. Not reliably following lower extremity commands for assessment Gait: Unable to assess   Lab Results: Basic Metabolic Panel: No results for input(s): "NA", "K", "CL", "CO2", "GLUCOSE", "BUN", "CREATININE", "CALCIUM", "MG", "PHOS" in the last 168 hours.  CBC: No results for input(s): "WBC", "NEUTROABS", "HGB", "HCT", "MCV", "PLT" in the last 168 hours.  Cardiac Enzymes: No results for input(s): "CKTOTAL", "CKMB", "CKMBINDEX", "TROPONINI" in the last 168 hours.  Lipid Panel: No results for input(s): "CHOL", "TRIG", "HDL", "CHOLHDL", "VLDL", "LDLCALC" in the last 168 hours.  Imaging: No results found.   Assessment: -  Plan: -  Daughter states that she would like the patient to be Full Code    Electronically signed: Dr. Caryl Pina 02/04/2023, 8:59 PM

## 2023-02-04 NOTE — Anesthesia Preprocedure Evaluation (Addendum)
Anesthesia Evaluation  Patient identified by MRN, date of birth, ID band Patient confused    Reviewed: Allergy & Precautions, H&P , NPO status , Patient's Chart, lab work & pertinent test results  Airway Mallampati: II  TM Distance: >3 FB Neck ROM: Full    Dental no notable dental hx. (+) Teeth Intact, Dental Advisory Given   Pulmonary COPD, Current Smoker   Pulmonary exam normal breath sounds clear to auscultation       Cardiovascular hypertension, Pt. on medications  Rhythm:Irregular Rate:Tachycardia     Neuro/Psych   Anxiety Depression    CVA, Residual Symptoms    GI/Hepatic negative GI ROS, Neg liver ROS,,,  Endo/Other  negative endocrine ROS    Renal/GU negative Renal ROS  negative genitourinary   Musculoskeletal  (+) Arthritis , Osteoarthritis,    Abdominal   Peds  Hematology negative hematology ROS (+)   Anesthesia Other Findings   Reproductive/Obstetrics negative OB ROS                             Anesthesia Physical Anesthesia Plan  ASA: 3 and emergent  Anesthesia Plan: General   Post-op Pain Management:    Induction: Intravenous, Rapid sequence and Cricoid pressure planned  PONV Risk Score and Plan: 3 and Ondansetron, Dexamethasone and Treatment may vary due to age or medical condition  Airway Management Planned: Oral ETT  Additional Equipment: Arterial line  Intra-op Plan:   Post-operative Plan: Extubation in OR and Possible Post-op intubation/ventilation  Informed Consent: I have reviewed the patients History and Physical, chart, labs and discussed the procedure including the risks, benefits and alternatives for the proposed anesthesia with the patient or authorized representative who has indicated his/her understanding and acceptance.     Dental advisory given  Plan Discussed with: CRNA  Anesthesia Plan Comments:        Anesthesia Quick Evaluation

## 2023-02-04 NOTE — ED Provider Notes (Signed)
Patient presents with evidence of a severe stroke.  CT imaging concerning for LVO and we are unable to contact family.  I two-physician consult team between myself and Dr. Otelia Limes reviewed the patient's chart extensively and do not see direct contraindications to TNK administration at this time.  I agree that the benefits of TNK administration and ultimate thrombectomy outweighs the risk of administering this medication and thus we will proceed with thrombolysis and thrombectomy.   Glendora Score, MD 02/04/23 2132

## 2023-02-04 NOTE — Code Documentation (Signed)
Responded to Code Stroke called at 2032 via phone to [facility] ED. Code Stroke called for slurred speech and R sided deficits, LSN-2000. Pt arrived at 2052, CBG-110,  NIH-18, CT head negative for acute changes. CTA-L MCA mid M1 occlusion. 4mg  zofran given at 2118 d/t nausea/gagging. TNK given at 2123 after 2 physician consent obtained d/t inability to contact family. IR paged out at 2130. Pt arrived in IR prior to page at 2129. Plan admission to ICU post-procedure.

## 2023-02-04 NOTE — Anesthesia Procedure Notes (Signed)
Procedure Name: Intubation Date/Time: 02/04/2023 9:57 PM  Performed by: Tressia Miners, CRNAPre-anesthesia Checklist: Patient identified, Emergency Drugs available, Suction available, Patient being monitored and Timeout performed Patient Re-evaluated:Patient Re-evaluated prior to induction Oxygen Delivery Method: Circle system utilized Preoxygenation: Pre-oxygenation with 100% oxygen Induction Type: IV induction, Rapid sequence and Cricoid Pressure applied Laryngoscope Size: Mac and 3 Grade View: Grade I Tube type: Oral Tube size: 7.0 mm Number of attempts: 1 Airway Equipment and Method: Stylet Placement Confirmation: ETT inserted through vocal cords under direct vision, positive ETCO2 and breath sounds checked- equal and bilateral Secured at: 21 cm Tube secured with: Tape Dental Injury: Teeth and Oropharynx as per pre-operative assessment  Comments: Smooth IV Induction. Eyes taped. RSI Performed. DL x 1 with grade 1 view. Atraumatically placed, teeth and lip remain intact as pre-op. Secured with tape. Bilateral breath sounds +/=, EtCO2 +, Adequate TV, VSS.

## 2023-02-04 NOTE — ED Triage Notes (Signed)
Pt arrives to ED via EMS c/o right sided weakness and slurred speech. LKN was 2000. Pt found on floor by neighbors. CBG 110

## 2023-02-04 NOTE — Procedures (Signed)
INR.  Status post left common carotid arteriogram.  Right CFA approach.  Findings.  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  Status post complete revascularization of occluded left middle cerebral artery with 1 pass with a 3 mm x 40 mm solitaireX retrieval device, and contact aspiration achieving a TICI 3  revascularization.  Post CT of the brain.  No evidence of intracranial hemorrhage .  7 French exoseal  closure device deployed for hemostasis at the  right groin puncture site with manual compression with quick clot for  approximately 20 to 25 minutes.  Distal pulses present bilaterally unchanged from prior to the procedure .  Patient extubated.  Following simple commands appropriately.  Pupils 3 mm bilaterally round regular and reactive sluggishly.  No facial asymmetry.  Tongue midline.  Patient able to bend her right knee and able to raise her right arm against gravity.  S Thressa Shiffer.MD

## 2023-02-04 NOTE — Progress Notes (Signed)
PHARMACIST CODE STROKE RESPONSE  Notified to mix TNK at 2119 by Dr. Otelia Limes TNK preparation completed at 2122  TNK dose = 16 mg IV over 5 seconds  Issues/delays encountered (if applicable): n/a  Cathie Hoops 02/04/23 9:23 PM

## 2023-02-04 NOTE — Anesthesia Procedure Notes (Signed)
Arterial Line Insertion Start/End5/13/2024 9:45 PM, 02/04/2023 9:51 PM Performed by: Adria Dill, CRNA, CRNA  Patient location: OOR procedure area. Emergency situation Lidocaine 1% used for infiltration Left, radial was placed Catheter size: 20 G Hand hygiene performed , maximum sterile barriers used  and Seldinger technique used Allen's test indicative of satisfactory collateral circulation Attempts: 1 Procedure performed without using ultrasound guided technique. Following insertion, dressing applied and Biopatch. Post procedure assessment: normal and unchanged  Patient tolerated the procedure well with no immediate complications.

## 2023-02-05 ENCOUNTER — Encounter (HOSPITAL_COMMUNITY): Payer: Self-pay | Admitting: Radiology

## 2023-02-05 ENCOUNTER — Inpatient Hospital Stay (HOSPITAL_COMMUNITY): Payer: Medicare Other

## 2023-02-05 DIAGNOSIS — I6389 Other cerebral infarction: Secondary | ICD-10-CM | POA: Diagnosis not present

## 2023-02-05 DIAGNOSIS — I63412 Cerebral infarction due to embolism of left middle cerebral artery: Secondary | ICD-10-CM

## 2023-02-05 DIAGNOSIS — I639 Cerebral infarction, unspecified: Secondary | ICD-10-CM

## 2023-02-05 LAB — CBC WITH DIFFERENTIAL/PLATELET
Abs Immature Granulocytes: 0.03 10*3/uL (ref 0.00–0.07)
Basophils Absolute: 0 10*3/uL (ref 0.0–0.1)
Basophils Relative: 0 %
Eosinophils Absolute: 0 10*3/uL (ref 0.0–0.5)
Eosinophils Relative: 0 %
HCT: 34.3 % — ABNORMAL LOW (ref 36.0–46.0)
Hemoglobin: 11.9 g/dL — ABNORMAL LOW (ref 12.0–15.0)
Immature Granulocytes: 0 %
Lymphocytes Relative: 4 %
Lymphs Abs: 0.4 10*3/uL — ABNORMAL LOW (ref 0.7–4.0)
MCH: 34.8 pg — ABNORMAL HIGH (ref 26.0–34.0)
MCHC: 34.7 g/dL (ref 30.0–36.0)
MCV: 100.3 fL — ABNORMAL HIGH (ref 80.0–100.0)
Monocytes Absolute: 0.1 10*3/uL (ref 0.1–1.0)
Monocytes Relative: 1 %
Neutro Abs: 7.9 10*3/uL — ABNORMAL HIGH (ref 1.7–7.7)
Neutrophils Relative %: 95 %
Platelets: 206 10*3/uL (ref 150–400)
RBC: 3.42 MIL/uL — ABNORMAL LOW (ref 3.87–5.11)
RDW: 12 % (ref 11.5–15.5)
WBC: 8.4 10*3/uL (ref 4.0–10.5)
nRBC: 0 % (ref 0.0–0.2)

## 2023-02-05 LAB — ECHOCARDIOGRAM COMPLETE
AR max vel: 1.93 cm2
AV Peak grad: 11.3 mmHg
Ao pk vel: 1.68 m/s
Area-P 1/2: 5.13 cm2
S' Lateral: 2.8 cm
Weight: 2264.57 oz

## 2023-02-05 LAB — LIPID PANEL
Cholesterol: 126 mg/dL (ref 0–200)
HDL: 54 mg/dL (ref >40)
LDL Cholesterol: 66 mg/dL (ref 0–99)
Total CHOL/HDL Ratio: 2.3 RATIO
Triglycerides: 28 mg/dL (ref <150)
VLDL: 6 mg/dL (ref 0–40)

## 2023-02-05 LAB — BASIC METABOLIC PANEL
Anion gap: 8 (ref 5–15)
BUN: 23 mg/dL (ref 8–23)
CO2: 23 mmol/L (ref 22–32)
Calcium: 8.6 mg/dL — ABNORMAL LOW (ref 8.9–10.3)
Chloride: 105 mmol/L (ref 98–111)
Creatinine, Ser: 0.91 mg/dL (ref 0.44–1.00)
GFR, Estimated: >60 mL/min (ref >60)
Glucose, Bld: 229 mg/dL — ABNORMAL HIGH (ref 70–99)
Potassium: 4 mmol/L (ref 3.5–5.1)
Sodium: 136 mmol/L (ref 135–145)

## 2023-02-05 LAB — HEMOGLOBIN A1C
Hgb A1c MFr Bld: 5 % (ref 4.8–5.6)
Mean Plasma Glucose: 96.8 mg/dL

## 2023-02-05 MED ORDER — ROSUVASTATIN CALCIUM 20 MG PO TABS
40.0000 mg | ORAL_TABLET | Freq: Every day | ORAL | Status: DC
  Administered 2023-02-05 – 2023-02-08 (×4): 40 mg via ORAL
  Filled 2023-02-05 (×4): qty 2

## 2023-02-05 MED ORDER — IPRATROPIUM-ALBUTEROL 0.5-2.5 (3) MG/3ML IN SOLN: 3.0000 mL | Freq: Four times a day (QID) | RESPIRATORY_TRACT | Status: DC | PRN

## 2023-02-05 MED ORDER — OYSTER SHELL CALCIUM/D3 500-5 MG-MCG PO TABS
1.0000 | ORAL_TABLET | Freq: Every day | ORAL | Status: DC
  Administered 2023-02-05 – 2023-02-08 (×4): 1 via ORAL
  Filled 2023-02-05 (×4): qty 1

## 2023-02-05 MED ORDER — LABETALOL HCL 5 MG/ML IV SOLN
INTRAVENOUS | Status: DC | PRN
  Administered 2023-02-04 (×3): 5 mg via INTRAVENOUS

## 2023-02-05 MED ORDER — NICOTINE 21 MG/24HR TD PT24
21.0000 mg | MEDICATED_PATCH | Freq: Every day | TRANSDERMAL | Status: DC
  Administered 2023-02-06 – 2023-02-08 (×3): 21 mg via TRANSDERMAL
  Filled 2023-02-05 (×3): qty 1

## 2023-02-05 MED ORDER — SUGAMMADEX SODIUM 200 MG/2ML IV SOLN
INTRAVENOUS | Status: DC | PRN
  Administered 2023-02-04: 200 mg via INTRAVENOUS

## 2023-02-05 MED ORDER — CHOLECALCIFEROL 10 MCG (400 UNIT) PO TABS
400.0000 [IU] | ORAL_TABLET | Freq: Every day | ORAL | Status: DC
  Administered 2023-02-05 – 2023-02-08 (×4): 400 [IU] via ORAL
  Filled 2023-02-05 (×5): qty 1

## 2023-02-05 MED ORDER — IPRATROPIUM-ALBUTEROL 0.5-2.5 (3) MG/3ML IN SOLN
3.0000 mL | Freq: Four times a day (QID) | RESPIRATORY_TRACT | Status: DC
  Administered 2023-02-05: 3 mL via RESPIRATORY_TRACT
  Filled 2023-02-05: qty 3

## 2023-02-05 MED ORDER — VITAMIN E 180 MG (400 UNIT) PO CAPS
400.0000 [IU] | ORAL_CAPSULE | Freq: Every day | ORAL | Status: DC
  Administered 2023-02-05 – 2023-02-07 (×3): 400 [IU] via ORAL
  Filled 2023-02-05 (×2): qty 1
  Filled 2023-02-05 (×2): qty 4

## 2023-02-05 MED ORDER — CHLORHEXIDINE GLUCONATE CLOTH 2 % EX PADS
6.0000 | MEDICATED_PAD | Freq: Every day | CUTANEOUS | Status: DC
  Administered 2023-02-05 – 2023-02-08 (×4): 6 via TOPICAL

## 2023-02-05 NOTE — Progress Notes (Signed)
Echocardiogram 2D Echocardiogram has been performed.  Lucendia Herrlich 02/05/2023, 1:00 PM

## 2023-02-05 NOTE — ED Provider Notes (Signed)
[facility] NEURO/TRAUMA/SURGICAL ICU Provider Note  CSN: [id number] Arrival date & time: 02/04/23 2052  Chief Complaint(s) Code Stroke  HPI Teresa Ford is a 77 y.o. female manage COPD, depression, HTN, HLD who presents emergency room for evaluation of right-sided weakness and aphasia.  History obtained from EMS who states that patient last known well was approximately 8 PM where she was watching TV with her family.  She was then found on the floor unable to speak or move.  Patient arrives with significant right-sided weakness and arrives as a code stroke.  Additional history unable to be obtained secondary to patient's acuity of condition and aphasia.   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 Authorizing Provider  aspirin 81 MG tablet Take 81 mg by mouth daily.    [provider]  bimatoprost (LUMIGAN) 0.01 % SOLN Place 1 drop into both eyes at bedtime.    [provider]  Calcium Carbonate-Vitamin D (CALCIUM 600/VITAMIN D PO) Take 2 tablets by mouth daily.    [provider]  cycloSPORINE, PF, (CEQUA) 0.09 % SOLN Place 1 drop into both eyes 2 (two) times daily.    [provider]  hydrochlorothiazide (HYDRODIURIL) 25 MG tablet Take 25 mg by mouth daily. 01/28/19   [provider]  INCRUSE  ELLIPTA 62.5 MCG/INH AEPB Inhale 1 puff into the lungs daily. 03/18/19   [provider]  Lifitegrast Benay Spice) 5 % SOLN 1 drop into affected eye    [provider]  loratadine (CLARITIN) 10 MG tablet Take 10 mg by mouth daily.    [provider]  losartan (COZAAR) 50 MG tablet Take 50 mg by mouth daily. 02/06/19   [provider]  Multiple Vitamins-Minerals (CENTRUM SILVER PO) Take by mouth daily.    [provider]  Omega-3 Fatty Acids (FISH OIL PO) Take by mouth.    [provider]  rosuvastatin (CRESTOR) 40 MG tablet Take 40 mg by mouth daily.    [provider]  VITAMIN D PO Take by mouth.    [provider]  vitamin E 180 MG (400 UNITS) capsule Take 400 Units by mouth daily.    [provider]  Past Surgical History Past Surgical History:  Procedure Laterality Date   BREAST EXCISIONAL BIOPSY Right 1972   benign   BREAST SURGERY  1972   benign tumor removed rt breast   BUNIONECTOMY  12/2002   RADIOLOGY WITH ANESTHESIA N/A 02/04/2023   Procedure: RADIOLOGY WITH ANESTHESIA;  Surgeon: Radiologist, Medication, MD;  Location: [facility] OR;  Service: Radiology;  Laterality: N/A;   VAGINAL HYSTERECTOMY  1976   Family History Family History  Problem Relation Age of Onset   Heart disease Mother    Dementia Mother    Osteoporosis Mother    Diabetes Father    Heart attack Father    Cancer Sister        lung cancer    Social History Social History   Tobacco Use   Smoking status: Every Day    Packs/day: 1.00    Years: 40.00    Additional pack years: 0.00    Total pack years: 40.00    Types: Cigarettes   Smokeless tobacco: Never  Substance Use Topics   Alcohol use: Yes    Alcohol/week: 3.0 - 4.0 standard drinks of alcohol    Types: 3 - 4 Glasses of wine per week   Drug use: No    Allergies Lamisil [terbinafine]  Review of Systems Review of Systems  Unable to perform ROS: Patient nonverbal    Physical Exam Vital Signs  I have reviewed the triage vital signs BP 99/87   Pulse 68   Temp 98.5 F (36.9 C) (Axillary)   Resp (!) 24   Wt 64.2 kg   LMP 09/24/1974   SpO2 94%   BMI 25.47 kg/m   Physical Exam Vitals and nursing note reviewed.  Constitutional:      General: She is in acute distress.     Appearance: She is well-developed. She is ill-appearing.  HENT:     Head: Normocephalic and atraumatic.  Eyes:     Conjunctiva/sclera: Conjunctivae normal.  Cardiovascular:     Rate and Rhythm: Normal rate and regular rhythm.     Heart sounds: No murmur heard. Pulmonary:     Effort: Pulmonary effort is normal. No respiratory distress.  Musculoskeletal:        General: No swelling.     Cervical back: Neck supple.  Skin:    General: Skin is warm and dry.     Capillary Refill: Capillary refill takes less than 2 seconds.  Neurological:     Mental Status: She is alert.     Motor: Weakness present.  Psychiatric:        Mood and Affect: Mood normal.     ED Results and Treatments Labs (all labs ordered are listed, but only abnormal results are displayed) Labs Reviewed  CBC - Abnormal; Notable for the following components:      Result Value   RBC 3.46 (*)    HCT 35.0 (*)    MCV 101.2 (*)    MCH 35.0 (*)    All other components within normal limits  COMPREHENSIVE METABOLIC PANEL - Abnormal; Notable for the following components:   Potassium 2.8 (*)    CO2 21 (*)    Glucose, Bld 117 (*)    Calcium 7.8 (*)    Total Protein 4.8 (*)    Albumin 2.8 (*)    All other components within normal limits  ETHANOL - Abnormal; Notable for the following components:   Alcohol, Ethyl (B) 100 (*)    All other components within normal limits  CBC WITH DIFFERENTIAL/PLATELET - Abnormal; Notable for the following components:   RBC 3.42 (*)    Hemoglobin 11.9 (*)     HCT 34.3 (*)    MCV 100.3 (*)    MCH 34.8 (*)    Neutro Abs 7.9 (*)    Lymphs Abs 0.4 (*)    All other components within normal limits  BASIC METABOLIC PANEL - Abnormal; Notable for the following components:   Glucose, Bld 229 (*)    Calcium 8.6 (*)    All other components within normal limits  I-STAT CHEM 8, ED - Abnormal; Notable for the following components:   Potassium 2.9 (*)    Glucose, Bld 112 (*)    Calcium, Ion 1.08 (*)    Hemoglobin 11.6 (*)    HCT 34.0 (*)    All other components within normal limits  CBG MONITORING, ED - Abnormal; Notable for the following components:   Glucose-Capillary 110 (*)    All other components within normal limits  MRSA NEXT GEN BY PCR, NASAL  PROTIME-INR  APTT  DIFFERENTIAL  LIPID PANEL  HEMOGLOBIN A1C                                                                                                                          Radiology ECHOCARDIOGRAM COMPLETE  Result Date: 02/05/2023    ECHOCARDIOGRAM REPORT   Patient Name:   Teresa Ford Date of Exam: 02/05/2023 Medical Rec #:  [id number]          Height:       62.5 in Accession #:    [id number]         Weight:       141.5 lb Date of Birth:  15-Apr-1946          BSA:          1.660 m Patient Age:    77 years           BP:           122/84 mmHg Patient Gender: F                  HR:           66 bpm. Exam Location:  Inpatient Procedure: 2D Echo, Cardiac Doppler and Color Doppler Indications:    Stroke I63.9  History:        Patient has no prior history of Echocardiogram examinations.                 Stroke; Risk Factors:Dyslipidemia and Current Smoker.  Sonographer:    Lucendia Herrlich Referring Phys: [phone] ERIC LINDZEN IMPRESSIONS  1. Left ventricular ejection fraction, by estimation, is 60 to 65%. The left ventricle has normal function. The left ventricle has no regional wall motion abnormalities. Left ventricular diastolic parameters are indeterminate.  2. Right ventricular systolic function is  normal. The right ventricular size is normal.  3. The mitral valve is degenerative. Mild mitral valve  regurgitation. No evidence of mitral stenosis.  4. The aortic valve is normal in structure. Aortic valve regurgitation is not visualized. No aortic stenosis is present.  5. The inferior vena cava is normal in size with greater than 50% respiratory variability, suggesting right atrial pressure of 3 mmHg. FINDINGS  Left Ventricle: Left ventricular ejection fraction, by estimation, is 60 to 65%. The left ventricle has normal function. The left ventricle has no regional wall motion abnormalities. The left ventricular internal cavity size was normal in size. There is  no left ventricular hypertrophy. Left ventricular diastolic parameters are indeterminate. Right Ventricle: The right ventricular size is normal. No increase in right ventricular wall thickness. Right ventricular systolic function is normal. Left Atrium: Left atrial size was normal in size. Right Atrium: Right atrial size was normal in size. Pericardium: There is no evidence of pericardial effusion. Presence of epicardial fat layer. Mitral Valve: The mitral valve is degenerative in appearance. Mild to moderate mitral annular calcification. Mild mitral valve regurgitation. No evidence of mitral valve stenosis. Tricuspid Valve: The tricuspid valve is normal in structure. Tricuspid valve regurgitation is not demonstrated. No evidence of tricuspid stenosis. Aortic Valve: The aortic valve is normal in structure. Aortic valve regurgitation is not visualized. No aortic stenosis is present. Aortic valve peak gradient measures 11.3 mmHg. Pulmonic Valve: The pulmonic valve was normal in structure. Pulmonic valve regurgitation is not visualized. No evidence of pulmonic stenosis. Aorta: The aortic root is normal in size and structure. Venous: The inferior vena cava is normal in size with greater than 50% respiratory variability, suggesting right atrial pressure of 3  mmHg. IAS/Shunts: No atrial level shunt detected by color flow Doppler.  LEFT VENTRICLE PLAX 2D LVIDd:         4.20 cm   Diastology LVIDs:         2.80 cm   LV e' medial:    9.48 cm/s LV PW:         0.90 cm   LV E/e' medial:  11.9 LV IVS:        0.70 cm   LV e' lateral:   10.60 cm/s LVOT diam:     2.00 cm   LV E/e' lateral: 10.7 LV SV:         69 LV SV Index:   42 LVOT Area:     3.14 cm  RIGHT VENTRICLE             IVC RV S prime:     13.50 cm/s  IVC diam: 2.00 cm TAPSE (M-mode): 2.4 cm LEFT ATRIUM           Index        RIGHT ATRIUM           Index LA diam:      3.70 cm 2.23 cm/m   RA Area:     15.45 cm LA Vol (A4C): 38.6 ml 23.22 ml/m  RA Volume:   37.90 ml  22.83 ml/m  AORTIC VALVE AV Area (Vmax): 1.93 cm AV Vmax:        168.00 cm/s AV Peak Grad:   11.3 mmHg LVOT Vmax:      103.47 cm/s LVOT Vmean:     67.267 cm/s LVOT VTI:       0.219 m  AORTA Ao Asc diam: 2.67 cm MITRAL VALVE                TRICUSPID VALVE MV Area (PHT): 5.13 cm     TR  Peak grad:   30.5 mmHg MV Decel Time: 148 msec     TR Vmax:        276.00 cm/s MV E velocity: 113.00 cm/s MV A velocity: 73.30 cm/s   SHUNTS MV E/A ratio:  1.54         Systemic VTI:  0.22 m                             Systemic Diam: 2.00 cm Kardie Tobb DO Electronically signed by Thomasene Ripple DO Signature Date/Time: 02/05/2023/1:23:43 PM    Final    CT HEAD CODE STROKE WO CONTRAST  Result Date: 02/04/2023 CLINICAL DATA:  Neuro deficit, acute, stroke suspected. Right-sided weakness. EXAM: CT HEAD WITHOUT CONTRAST CT ANGIOGRAPHY OF THE HEAD AND NECK TECHNIQUE: Contiguous axial images were obtained from the base of the skull through the vertex without intravenous contrast. Multidetector CT imaging of the head and neck was performed using the standard protocol during bolus administration of intravenous contrast. Multiplanar CT image reconstructions and MIPs were obtained to evaluate the vascular anatomy. Carotid stenosis measurements (when applicable) are obtained utilizing  NASCET criteria, using the distal internal carotid diameter as the denominator. RADIATION DOSE REDUCTION: This exam was performed according to the departmental dose-optimization program which includes automated exposure control, adjustment of the mA and/or kV according to patient size and/or use of iterative reconstruction technique. CONTRAST:  OMNIPAQUE IOHEXOL 350 MG/ML SOLN COMPARISON:  None Available. FINDINGS: CT HEAD Brain: No acute intracranial hemorrhage. Gray-white differentiation is preserved. No hydrocephalus or extra-axial collection. No mass effect or midline shift. ASPECT score is 10. Vascular: No hyperdense vessel or unexpected calcification. Skull: No calvarial fracture or suspicious bone lesion. Skull base is unremarkable. Sinuses/Orbits: Unremarkable. CTA NECK Aortic arch: Three-vessel arch configuration. Atherosclerotic calcifications of the aortic arch and arch vessel origins. Arch vessel origins are patent. Right carotid system: Calcified plaque results in less than 50% stenosis of the proximal right cervical ICA. Left carotid system: Calcified plaque results in less than 50% stenosis of the proximal left cervical ICA. Vertebral arteries:Patent from the origin to the confluence with the basilar without stenosis or dissection. Skeleton: Mild cervical spondylosis without high-grade spinal canal stenosis. Other neck: Emphysema in the lung apices.  Otherwise unremarkable. CTA HEAD Anterior circulation: Calcified plaque along the carotid siphons without hemodynamically significant stenosis. Abrupt cut off of the left MCA, mid M1 segment. The right MCA and bilateral ACAs are patent without stenosis or aneurysm. Distal branches are symmetric. Posterior circulation: Normal basilar artery. The SCAs, AICAs and PICAs are patent proximally. The PCAs are patent proximally without stenosis or aneurysm. Distal branches are symmetric. Venous sinuses: Patent. Anatomic variants: None. IMPRESSION: 1. No  acute intracranial hemorrhage. ASPECT score is 10. 2. Acute occlusion of the left MCA, mid M1 segment. Distal branches are symmetric. 3. No hemodynamically significant stenosis in the neck. Aortic Atherosclerosis (ICD10-I70.0) and Emphysema (ICD10-J43.9). Code stroke imaging results were communicated on 02/04/2023 at 9:28 pm to provider Dr. Otelia Limes via secure text paging. Electronically Signed   By: Orvan Falconer M.D.   On: 02/04/2023 21:34   CT ANGIO HEAD NECK W WO CM (CODE STROKE)  Result Date: 02/04/2023 CLINICAL DATA:  Neuro deficit, acute, stroke suspected. Right-sided weakness. EXAM: CT HEAD WITHOUT CONTRAST CT ANGIOGRAPHY OF THE HEAD AND NECK TECHNIQUE: Contiguous axial images were obtained from the base of the skull through the vertex without intravenous contrast. Multidetector CT imaging of the  head and neck was performed using the standard protocol during bolus administration of intravenous contrast. Multiplanar CT image reconstructions and MIPs were obtained to evaluate the vascular anatomy. Carotid stenosis measurements (when applicable) are obtained utilizing NASCET criteria, using the distal internal carotid diameter as the denominator. RADIATION DOSE REDUCTION: This exam was performed according to the departmental dose-optimization program which includes automated exposure control, adjustment of the mA and/or kV according to patient size and/or use of iterative reconstruction technique. CONTRAST:  OMNIPAQUE IOHEXOL 350 MG/ML SOLN COMPARISON:  None Available. FINDINGS: CT HEAD Brain: No acute intracranial hemorrhage. Gray-white differentiation is preserved. No hydrocephalus or extra-axial collection. No mass effect or midline shift. ASPECT score is 10. Vascular: No hyperdense vessel or unexpected calcification. Skull: No calvarial fracture or suspicious bone lesion. Skull base is unremarkable. Sinuses/Orbits: Unremarkable. CTA NECK Aortic arch: Three-vessel arch configuration.  Atherosclerotic calcifications of the aortic arch and arch vessel origins. Arch vessel origins are patent. Right carotid system: Calcified plaque results in less than 50% stenosis of the proximal right cervical ICA. Left carotid system: Calcified plaque results in less than 50% stenosis of the proximal left cervical ICA. Vertebral arteries:Patent from the origin to the confluence with the basilar without stenosis or dissection. Skeleton: Mild cervical spondylosis without high-grade spinal canal stenosis. Other neck: Emphysema in the lung apices.  Otherwise unremarkable. CTA HEAD Anterior circulation: Calcified plaque along the carotid siphons without hemodynamically significant stenosis. Abrupt cut off of the left MCA, mid M1 segment. The right MCA and bilateral ACAs are patent without stenosis or aneurysm. Distal branches are symmetric. Posterior circulation: Normal basilar artery. The SCAs, AICAs and PICAs are patent proximally. The PCAs are patent proximally without stenosis or aneurysm. Distal branches are symmetric. Venous sinuses: Patent. Anatomic variants: None. IMPRESSION: 1. No acute intracranial hemorrhage. ASPECT score is 10. 2. Acute occlusion of the left MCA, mid M1 segment. Distal branches are symmetric. 3. No hemodynamically significant stenosis in the neck. Aortic Atherosclerosis (ICD10-I70.0) and Emphysema (ICD10-J43.9). Code stroke imaging results were communicated on 02/04/2023 at 9:28 pm to provider Dr. Otelia Limes via secure text paging. Electronically Signed   By: Orvan Falconer M.D.   On: 02/04/2023 21:34    Pertinent labs & imaging results that were available during my care of the patient were reviewed by me and considered in my medical decision making (see MDM for details).  Medications Ordered in ED Medications  sodium chloride flush (NS) 0.9 % injection 3 mL (has no administration in time range)  acetaminophen (TYLENOL) tablet 650 mg (has no administration in time range)    Or   acetaminophen (TYLENOL) 160 MG/5ML solution 650 mg (has no administration in time range)    Or  acetaminophen (TYLENOL) suppository 650 mg (has no administration in time range)  senna-docusate (Senokot-S) tablet 1 tablet (has no administration in time range)  pantoprazole (PROTONIX) injection 40 mg (has no administration in time range)  acetaminophen (TYLENOL) tablet 650 mg (has no administration in time range)    Or  acetaminophen (TYLENOL) 160 MG/5ML solution 650 mg (has no administration in time range)    Or  acetaminophen (TYLENOL) suppository 650 mg (has no administration in time range)  clevidipine (CLEVIPREX) infusion 0.5 mg/mL ( Intravenous Not Given 02/05/23 0112)  Chlorhexidine Gluconate Cloth 2 % PADS 6 each (has no administration in time range)  calcium-vitamin D (OSCAL WITH D) 500-5 MG-MCG per tablet 1 tablet (1 tablet Oral Given 02/05/23 1040)  rosuvastatin (CRESTOR) tablet 40 mg (40 mg Oral  Given 02/05/23 1040)  vitamin E capsule 400 Units (400 Units Oral Given 02/05/23 1042)  cholecalciferol (VITAMIN D3) 10 MCG (400 UNIT) tablet 400 Units (400 Units Oral Given 02/05/23 1042)  ipratropium-albuterol (DUONEB) 0.5-2.5 (3) MG/3ML nebulizer solution 3 mL (has no administration in time range)  ondansetron (ZOFRAN) injection 4 mg (4 mg Intravenous Given 02/04/23 2118)  tenecteplase (TNKASE) injection for Stroke 16 mg (16 mg Intravenous Given 02/04/23 2123)  iohexol (OMNIPAQUE) 350 MG/ML injection 125 mL (125 mLs Intravenous Contrast Given 02/04/23 2121)   stroke: early stages of recovery book (1 each Does not apply Given 02/05/23 1040)  iohexol (OMNIPAQUE) 300 MG/ML solution 150 mL (75 mLs Intra-arterial Contrast Given 02/04/23 2317)                                                                                                                                     Procedures .Critical Care  Performed by: Glendora Score, MD Authorized by: Glendora Score, MD   Critical care provider  statement:    Critical care time (minutes):  30   Critical care was necessary to treat or prevent imminent or life-threatening deterioration of the following conditions:  CNS failure or compromise   Critical care was time spent personally by me on the following activities:  Development of treatment plan with patient or surrogate, discussions with consultants, evaluation of patient's response to treatment, examination of patient, ordering and review of laboratory studies, ordering and review of radiographic studies, ordering and performing treatments and interventions, pulse oximetry, re-evaluation of patient's condition and review of old charts   (including critical care time)  Medical Decision Making / ED Course   This patient presents to the ED for concern of weakness, aphasia, this involves an extensive number of treatment options, and is a complaint that carries with it a high risk of complications and morbidity.  The differential diagnosis includes CVA, intracranial bleed, Todd's paralysis, electrolyte abnormality, hypoglycemia  MDM: Patient seen emergency room for evaluation of weakness and aphasia concern for stroke.  Physical exam with significant right-sided weakness and aphasia.  Patient arrives as a stroke alert and was taken immediately to the CAT scanner where vessel imaging is concerning for an acute left M1 occlusion.  Patient is LVO positive and we were unable to get a hold of family and thus had to make a two-physician decision analyzing the risks and benefits of TNK administration with minimal information.  Both Dr. Otelia Limes the stroke neurologist and myself are in agreement that we should proceed with TNK administration and ultimate thrombectomy as the benefits outweigh the risks and TNK was administered.  Patient then taken for thrombectomy and admitted to the stroke neurology service.   Additional history obtained:  -External records from outside source obtained and reviewed  including: Chart review including previous notes, labs, imaging, consultation notes   Lab Tests: -I ordered, reviewed, and interpreted labs.   The pertinent results  include:   Labs Reviewed  CBC - Abnormal; Notable for the following components:      Result Value   RBC 3.46 (*)    HCT 35.0 (*)    MCV 101.2 (*)    MCH 35.0 (*)    All other components within normal limits  COMPREHENSIVE METABOLIC PANEL - Abnormal; Notable for the following components:   Potassium 2.8 (*)    CO2 21 (*)    Glucose, Bld 117 (*)    Calcium 7.8 (*)    Total Protein 4.8 (*)    Albumin 2.8 (*)    All other components within normal limits  ETHANOL - Abnormal; Notable for the following components:   Alcohol, Ethyl (B) 100 (*)    All other components within normal limits  CBC WITH DIFFERENTIAL/PLATELET - Abnormal; Notable for the following components:   RBC 3.42 (*)    Hemoglobin 11.9 (*)    HCT 34.3 (*)    MCV 100.3 (*)    MCH 34.8 (*)    Neutro Abs 7.9 (*)    Lymphs Abs 0.4 (*)    All other components within normal limits  BASIC METABOLIC PANEL - Abnormal; Notable for the following components:   Glucose, Bld 229 (*)    Calcium 8.6 (*)    All other components within normal limits  I-STAT CHEM 8, ED - Abnormal; Notable for the following components:   Potassium 2.9 (*)    Glucose, Bld 112 (*)    Calcium, Ion 1.08 (*)    Hemoglobin 11.6 (*)    HCT 34.0 (*)    All other components within normal limits  CBG MONITORING, ED - Abnormal; Notable for the following components:   Glucose-Capillary 110 (*)    All other components within normal limits  MRSA NEXT GEN BY PCR, NASAL  PROTIME-INR  APTT  DIFFERENTIAL  LIPID PANEL  HEMOGLOBIN A1C      Imaging Studies ordered: I ordered imaging studies including CT head, CT angio brain and neck I independently visualized and interpreted imaging. I agree with the radiologist interpretation   Medicines ordered and prescription drug management: Meds  ordered this encounter  Medications   sodium chloride flush (NS) 0.9 % injection 3 mL   ondansetron (ZOFRAN) injection 4 mg   tenecteplase (TNKASE) injection for Stroke 16 mg   iohexol (OMNIPAQUE) 350 MG/ML injection 125 mL    stroke: early stages of recovery book   DISCONTD: 0.9 %  sodium chloride infusion   OR Linked Order Group    acetaminophen (TYLENOL) tablet 650 mg    acetaminophen (TYLENOL) 160 MG/5ML solution 650 mg    acetaminophen (TYLENOL) suppository 650 mg   senna-docusate (Senokot-S) tablet 1 tablet   pantoprazole (PROTONIX) injection 40 mg   DISCONTD: clevidipine (CLEVIPREX) infusion 0.5 mg/mL   OR Linked Order Group    acetaminophen (TYLENOL) tablet 650 mg    acetaminophen (TYLENOL) 160 MG/5ML solution 650 mg    acetaminophen (TYLENOL) suppository 650 mg   DISCONTD: 0.9 %  sodium chloride infusion   clevidipine (CLEVIPREX) infusion 0.5 mg/mL   iohexol (OMNIPAQUE) 300 MG/ML solution 150 mL   Chlorhexidine Gluconate Cloth 2 % PADS 6 each   DISCONTD: ipratropium-albuterol (DUONEB) 0.5-2.5 (3) MG/3ML nebulizer solution 3 mL   calcium-vitamin D (OSCAL WITH D) 500-5 MG-MCG per tablet 1 tablet   rosuvastatin (CRESTOR) tablet 40 mg   vitamin E capsule 400 Units   cholecalciferol (VITAMIN D3) 10 MCG (400 UNIT) tablet 400 Units  ipratropium-albuterol (DUONEB) 0.5-2.5 (3) MG/3ML nebulizer solution 3 mL    -I have reviewed the patients home medicines and have made adjustments as needed  Critical interventions Stroke activation, TNK administration  Consultations Obtained: I requested consultation with the stroke neurologist Dr. Otelia Limes,  and discussed lab and imaging findings as well as pertinent plan - they recommend: TNK and IR suite for thrombectomy   Cardiac Monitoring: The patient was maintained on a cardiac monitor.  I personally viewed and interpreted the cardiac monitored which showed an underlying rhythm of: NSR  Social Determinants of Health:  Factors  impacting patients care include: none   Reevaluation: After the interventions noted above, I reevaluated the patient and found that they have :stayed the same  Co morbidities that complicate the patient evaluation  Past Medical History:  Diagnosis Date   Anxiety    Arthritis    COPD (chronic obstructive pulmonary disease) (HCC)    Depression    Hyperlipidemia    Hypertension    Mixed incontinence 2010   Osteopenia    Overweight    Prediabetes    Renal cyst    left   Tobacco abuse       Dispostion: I considered admission for this patient, and due to acute CVA with LVO and TNK administration patient require hospital admission     Final Clinical Impression(s) / ED Diagnoses Final diagnoses:  Stroke (cerebrum) [facility])     @PCDICTATION @    Glendora Score, MD 02/05/23 1517

## 2023-02-05 NOTE — Progress Notes (Signed)
  Transition of Care [facility]) Screening Note   Patient Details  Name: Teresa Ford Date of Birth: July 22, 1946   Transition of Care [facility]) CM/SW Contact:    Glennon Mac, RN Phone Number: 02/05/2023, 5:06 PM    Transition of Care Department [facility]) has reviewed patient and no TOC needs have been identified at this time. We will continue to monitor patient advancement through interdisciplinary progression rounds. If new patient transition needs arise, please place a TOC consult.  Quintella Baton, RN, BSN  Trauma/Neuro ICU Case Manager [phone]

## 2023-02-05 NOTE — Progress Notes (Signed)
Referring Physician(s): Stroke team, Caryl Pina   Supervising Physician: Julieanne Cotton  Patient Status:  [facility] - In-pt  Chief Complaint:  Code stroke acute onset of right sided weakness and aphasia  S/p Lt MCA thrombectomy TICI 3 with Dr. Corliss Skains on 5/13  Closing device failed, manual compression with quick clot applied for  approximately 20 to 25 minutes.   Subjective:  Patient sitting in bed drinking coffee, close friends at bedside.  States that she has no complaints, denies HA, vision changes, arm/leg weakness.     Allergies: Lamisil [terbinafine]  Medications: Prior to Admission medications   Medication Sig Start Date End Date Taking? Authorizing Provider  aspirin 81 MG tablet Take 81 mg by mouth daily.    [provider]  Calcium Carbonate-Vitamin D (CALCIUM 600/VITAMIN D PO) Take 2 tablets by mouth daily.    [provider]  hydrochlorothiazide (HYDRODIURIL) 25 MG tablet  01/28/19   [provider]  INCRUSE ELLIPTA 62.5 MCG/INH AEPB  03/18/19   [provider]  Lifitegrast Benay Spice) 5 % SOLN 1 drop into affected eye    [provider]  loratadine (CLARITIN) 10 MG tablet Take 10 mg by mouth daily.    [provider]  losartan (COZAAR) 50 MG tablet  02/06/19   [provider]  Multiple Vitamins-Minerals (CENTRUM SILVER PO) Take by mouth daily.    [provider]  Omega-3 Fatty Acids (FISH OIL PO) Take by mouth.    [provider]  rosuvastatin (CRESTOR) 40 MG tablet Take 40 mg by mouth daily.    [provider]  VITAMIN D PO Take by mouth.    [provider]  vitamin E 180 MG (400 UNITS) capsule Take 400 Units by mouth daily.    [provider]     Vital Signs: BP 122/84   Pulse 68   Temp 98.8 F (37.1 C) (Axillary)   Resp 18   Wt 141 lb 8.6 oz (64.2 kg)   LMP 09/24/1974   SpO2 97%   BMI 25.47 kg/m   Physical Exam Vitals reviewed.   Constitutional:      General: She is not in acute distress.    Appearance: She is not ill-appearing.  HENT:     Head: Normocephalic.  Eyes:     Extraocular Movements: Extraocular movements intact.     Pupils: Pupils are equal, round, and reactive to light.  Cardiovascular:     Rate and Rhythm: Normal rate and regular rhythm.  Pulmonary:     Effort: Pulmonary effort is normal.  Abdominal:     General: Abdomen is flat.     Palpations: Abdomen is soft.  Skin:    General: Skin is warm and dry.     Coloration: Skin is not jaundiced or pale.     Comments: Positive dressing on R CFA puncture site. Site with ecchymosis, site is soft and non tender. Puncture site with small oozing of blood transferred to globe. No appreciable pseudoaneurysm. R DP 1+   Neurological:     Mental Status: She is alert and oriented to person, place, and time.     Comments: Alert, awake, and oriented x3 Speech and comprehension intact PERRL  EOMs intact, no nystagmus or subjective diplopia. Mild right  facial droop. Tongue midline  Motor power 5/5 all 4, weakest in right hand  No pronator drift. Fine motor and coordination intact  Distal pulses R DP 1+   Psychiatric:  Mood and Affect: Mood normal.        Behavior: Behavior normal.     Imaging: CT HEAD CODE STROKE WO CONTRAST  Result Date: 02/04/2023 CLINICAL DATA:  Neuro deficit, acute, stroke suspected. Right-sided weakness. EXAM: CT HEAD WITHOUT CONTRAST CT ANGIOGRAPHY OF THE HEAD AND NECK TECHNIQUE: Contiguous axial images were obtained from the base of the skull through the vertex without intravenous contrast. Multidetector CT imaging of the head and neck was performed using the standard protocol during bolus administration of intravenous contrast. Multiplanar CT image reconstructions and MIPs were obtained to evaluate the vascular anatomy. Carotid stenosis measurements (when applicable) are obtained utilizing NASCET criteria, using the distal  internal carotid diameter as the denominator. RADIATION DOSE REDUCTION: This exam was performed according to the departmental dose-optimization program which includes automated exposure control, adjustment of the mA and/or kV according to patient size and/or use of iterative reconstruction technique. CONTRAST:  OMNIPAQUE IOHEXOL 350 MG/ML SOLN COMPARISON:  None Available. FINDINGS: CT HEAD Brain: No acute intracranial hemorrhage. Gray-white differentiation is preserved. No hydrocephalus or extra-axial collection. No mass effect or midline shift. ASPECT score is 10. Vascular: No hyperdense vessel or unexpected calcification. Skull: No calvarial fracture or suspicious bone lesion. Skull base is unremarkable. Sinuses/Orbits: Unremarkable. CTA NECK Aortic arch: Three-vessel arch configuration. Atherosclerotic calcifications of the aortic arch and arch vessel origins. Arch vessel origins are patent. Right carotid system: Calcified plaque results in less than 50% stenosis of the proximal right cervical ICA. Left carotid system: Calcified plaque results in less than 50% stenosis of the proximal left cervical ICA. Vertebral arteries:Patent from the origin to the confluence with the basilar without stenosis or dissection. Skeleton: Mild cervical spondylosis without high-grade spinal canal stenosis. Other neck: Emphysema in the lung apices.  Otherwise unremarkable. CTA HEAD Anterior circulation: Calcified plaque along the carotid siphons without hemodynamically significant stenosis. Abrupt cut off of the left MCA, mid M1 segment. The right MCA and bilateral ACAs are patent without stenosis or aneurysm. Distal branches are symmetric. Posterior circulation: Normal basilar artery. The SCAs, AICAs and PICAs are patent proximally. The PCAs are patent proximally without stenosis or aneurysm. Distal branches are symmetric. Venous sinuses: Patent. Anatomic variants: None. IMPRESSION: 1. No acute intracranial hemorrhage. ASPECT  score is 10. 2. Acute occlusion of the left MCA, mid M1 segment. Distal branches are symmetric. 3. No hemodynamically significant stenosis in the neck. Aortic Atherosclerosis (ICD10-I70.0) and Emphysema (ICD10-J43.9). Code stroke imaging results were communicated on 02/04/2023 at 9:28 pm to provider Dr. Otelia Limes via secure text paging. Electronically Signed   By: Orvan Falconer M.D.   On: 02/04/2023 21:34   CT ANGIO HEAD NECK W WO CM (CODE STROKE)  Result Date: 02/04/2023 CLINICAL DATA:  Neuro deficit, acute, stroke suspected. Right-sided weakness. EXAM: CT HEAD WITHOUT CONTRAST CT ANGIOGRAPHY OF THE HEAD AND NECK TECHNIQUE: Contiguous axial images were obtained from the base of the skull through the vertex without intravenous contrast. Multidetector CT imaging of the head and neck was performed using the standard protocol during bolus administration of intravenous contrast. Multiplanar CT image reconstructions and MIPs were obtained to evaluate the vascular anatomy. Carotid stenosis measurements (when applicable) are obtained utilizing NASCET criteria, using the distal internal carotid diameter as the denominator. RADIATION DOSE REDUCTION: This exam was performed according to the departmental dose-optimization program which includes automated exposure control, adjustment of the mA and/or kV according to patient size and/or use of iterative reconstruction technique. CONTRAST:  OMNIPAQUE IOHEXOL 350 MG/ML SOLN  COMPARISON:  None Available. FINDINGS: CT HEAD Brain: No acute intracranial hemorrhage. Gray-white differentiation is preserved. No hydrocephalus or extra-axial collection. No mass effect or midline shift. ASPECT score is 10. Vascular: No hyperdense vessel or unexpected calcification. Skull: No calvarial fracture or suspicious bone lesion. Skull base is unremarkable. Sinuses/Orbits: Unremarkable. CTA NECK Aortic arch: Three-vessel arch configuration. Atherosclerotic calcifications of the aortic arch  and arch vessel origins. Arch vessel origins are patent. Right carotid system: Calcified plaque results in less than 50% stenosis of the proximal right cervical ICA. Left carotid system: Calcified plaque results in less than 50% stenosis of the proximal left cervical ICA. Vertebral arteries:Patent from the origin to the confluence with the basilar without stenosis or dissection. Skeleton: Mild cervical spondylosis without high-grade spinal canal stenosis. Other neck: Emphysema in the lung apices.  Otherwise unremarkable. CTA HEAD Anterior circulation: Calcified plaque along the carotid siphons without hemodynamically significant stenosis. Abrupt cut off of the left MCA, mid M1 segment. The right MCA and bilateral ACAs are patent without stenosis or aneurysm. Distal branches are symmetric. Posterior circulation: Normal basilar artery. The SCAs, AICAs and PICAs are patent proximally. The PCAs are patent proximally without stenosis or aneurysm. Distal branches are symmetric. Venous sinuses: Patent. Anatomic variants: None. IMPRESSION: 1. No acute intracranial hemorrhage. ASPECT score is 10. 2. Acute occlusion of the left MCA, mid M1 segment. Distal branches are symmetric. 3. No hemodynamically significant stenosis in the neck. Aortic Atherosclerosis (ICD10-I70.0) and Emphysema (ICD10-J43.9). Code stroke imaging results were communicated on 02/04/2023 at 9:28 pm to provider Dr. Otelia Limes via secure text paging. Electronically Signed   By: Orvan Falconer M.D.   On: 02/04/2023 21:34    Labs:  CBC: Recent Labs    02/04/23 2054 02/04/23 2103 02/05/23 0618  WBC 5.8  --  8.4  HGB 12.1 11.6* 11.9*  HCT 35.0* 34.0* 34.3*  PLT 217  --  206    COAGS: Recent Labs    02/04/23 2054  INR 1.0  APTT 30    BMP: Recent Labs    02/04/23 2054 02/04/23 2103 02/05/23 0618  NA 137 140 136  K 2.8* 2.9* 4.0  CL 105 104 105  CO2 21*  --  23  GLUCOSE 117* 112* 229*  BUN 21 22 23   CALCIUM 7.8*  --  8.6*   CREATININE 0.87 1.00 0.91  GFRNONAA >60  --  >60    LIVER FUNCTION TESTS: Recent Labs    02/04/23 2054  BILITOT 0.4  AST 24  ALT 14  ALKPHOS 43  PROT 4.8*  ALBUMIN 2.8*    Assessment and Plan:  77 y.o. female with arthritis, anxiety, COPD, depression, HLD, HTN, prediabetes and tobacco abuse who presented to the ED from home via EMS as a Code Stroke after acute onset of right sided weakness and aphasia  yesterday, she is S/p Lt MCA thrombectomy TICI 3 with Dr. Corliss Skains on 5/13.   VSS RF wnl Speech intact, motor power 5/5 all 4 but weakest in right hand. Mild right facial droop.  R CAF puncture site with ecchymosis and small oozing from the tract. No appreciable pseudoaneurysm. - discussed with Dr. Corliss Skains, STAT Vas [place] ordered to check for hematoma and  possible pseudoaneurysm.   Remain bedrest util Vas [place] is performed.   Further treatment plan per Stroke team  Appreciate and agree with the plan.  NIR to follow.    Electronically Signed: Willette Brace, PA-C 02/05/2023, 8:33 AM   I spent a total  of 25 Minutes at the the patient's bedside AND on the patient's hospital floor or unit, greater than 50% of which was counseling/coordinating care for L MCA mechanical thrombectomy.   This chart was dictated using voice recognition software.  Despite best efforts to proofread,  errors can occur which can change the documentation meaning.

## 2023-02-05 NOTE — Progress Notes (Signed)
PT Cancellation Note  Patient Details Name: TIFINI HOXIT MRN: [id number] DOB: 1946/08/25   Cancelled Treatment:    Reason Eval/Treat Not Completed: Active bedrest order  Patient remains on strict bedrest. Will follow-up to see if bedrest order is lifted when neurology and IR round on pt.    Jerolyn Center, PT Acute Rehabilitation Services  Office [phone]  Zena Amos 02/05/2023, 8:14 AM

## 2023-02-05 NOTE — Progress Notes (Signed)
eLink Physician-Brief Progress Note Patient Name: Teresa Ford DOB: [birth year] MRN: [id number]   Date of Service  02/05/2023  HPI/Events of Note  77 year old female with a history of essential hypertension, dyslipidemia, prediabetes and tobacco use initially presented to the emergency department with code stroke called for slurred speech, right-sided deficits.  NIH stroke scale was 18.  CT head was negative for acute changes but CT angio revealed a left MCA mid M1 occlusion.  She received tenecteplase at 2123 and she emergently underwent left MCA M1 segment revascularization with tiki 3 flow..  She was extubated and returned to the ICU for further management.  She was normotensive on evaluation.  Saturating 94% on room air.  She was transiently on phenylephrine while undergoing the procedure.  Metabolic panel shows severe hypokalemia down to 2.9.  CBC shows macrocytosis.  Alcohol level was 100.  eICU Interventions  Postoperatively, the patient was given Cleviprex with SBP goal 120-140.  Continuous IVF with normal saline.  Tenecteplase precautions in place.  Currently n.p.o. given dysarthria pending speech evaluation  MRI pending  Restratification labs ordered and pending.  Add on scheduled nebs.     Intervention Category Evaluation Type: New Patient Evaluation  Teresa Ford 02/05/2023, 12:41 AM

## 2023-02-05 NOTE — Progress Notes (Addendum)
STROKE TEAM PROGRESS NOTE   INTERVAL HISTORY No family at the bedside. Her symptoms of right side weakness, aphasia and dysarthria have improved still with slight right facial droop.  Mri brain scheduled for 2130 tonight for 24 imaging post TNK.  VS stable, las are stable.  Will d/c aline and IVF   Vitals:   02/05/23 0600 02/05/23 0630 02/05/23 0700 02/05/23 0800  BP: 126/73 137/73 122/84   Pulse: 69 66 68   Resp: 18 19 18    Temp:    98.8 F (37.1 C)  TempSrc:    Axillary  SpO2: 98% 97% 97%   Weight:       CBC:  Recent Labs  Lab 02/04/23 2054 02/04/23 2103 02/05/23 0618  WBC 5.8  --  8.4  NEUTROABS 3.3  --  7.9*  HGB 12.1 11.6* 11.9*  HCT 35.0* 34.0* 34.3*  MCV 101.2*  --  100.3*  PLT 217  --  206   Basic Metabolic Panel:  Recent Labs  Lab 02/04/23 2054 02/04/23 2103 02/05/23 0618  NA 137 140 136  K 2.8* 2.9* 4.0  CL 105 104 105  CO2 21*  --  23  GLUCOSE 117* 112* 229*  BUN 21 22 23   CREATININE 0.87 1.00 0.91  CALCIUM 7.8*  --  8.6*   Lipid Panel:  Recent Labs  Lab 02/05/23 0618  CHOL 126  TRIG 28  HDL 54  CHOLHDL 2.3  VLDL 6  LDLCALC 66   HgbA1c:  Recent Labs  Lab 02/05/23 0618  HGBA1C 5.0   Urine Drug Screen: No results for input(s): "LABOPIA", "COCAINSCRNUR", "LABBENZ", "AMPHETMU", "THCU", "LABBARB" in the last 168 hours.  Alcohol Level  Recent Labs  Lab 02/04/23 2054  ETH 100*    IMAGING past 24 hours CT HEAD CODE STROKE WO CONTRAST  Result Date: 02/04/2023 CLINICAL DATA:  Neuro deficit, acute, stroke suspected. Right-sided weakness. EXAM: CT HEAD WITHOUT CONTRAST CT ANGIOGRAPHY OF THE HEAD AND NECK TECHNIQUE: Contiguous axial images were obtained from the base of the skull through the vertex without intravenous contrast. Multidetector CT imaging of the head and neck was performed using the standard protocol during bolus administration of intravenous contrast. Multiplanar CT image reconstructions and MIPs were obtained to evaluate the  vascular anatomy. Carotid stenosis measurements (when applicable) are obtained utilizing NASCET criteria, using the distal internal carotid diameter as the denominator. RADIATION DOSE REDUCTION: This exam was performed according to the departmental dose-optimization program which includes automated exposure control, adjustment of the mA and/or kV according to patient size and/or use of iterative reconstruction technique. CONTRAST:  OMNIPAQUE IOHEXOL 350 MG/ML SOLN COMPARISON:  None Available. FINDINGS: CT HEAD Brain: No acute intracranial hemorrhage. Gray-white differentiation is preserved. No hydrocephalus or extra-axial collection. No mass effect or midline shift. ASPECT score is 10. Vascular: No hyperdense vessel or unexpected calcification. Skull: No calvarial fracture or suspicious bone lesion. Skull base is unremarkable. Sinuses/Orbits: Unremarkable. CTA NECK Aortic arch: Three-vessel arch configuration. Atherosclerotic calcifications of the aortic arch and arch vessel origins. Arch vessel origins are patent. Right carotid system: Calcified plaque results in less than 50% stenosis of the proximal right cervical ICA. Left carotid system: Calcified plaque results in less than 50% stenosis of the proximal left cervical ICA. Vertebral arteries:Patent from the origin to the confluence with the basilar without stenosis or dissection. Skeleton: Mild cervical spondylosis without high-grade spinal canal stenosis. Other neck: Emphysema in the lung apices.  Otherwise unremarkable. CTA HEAD Anterior circulation: Calcified plaque along  the carotid siphons without hemodynamically significant stenosis. Abrupt cut off of the left MCA, mid M1 segment. The right MCA and bilateral ACAs are patent without stenosis or aneurysm. Distal branches are symmetric. Posterior circulation: Normal basilar artery. The SCAs, AICAs and PICAs are patent proximally. The PCAs are patent proximally without stenosis or aneurysm. Distal  branches are symmetric. Venous sinuses: Patent. Anatomic variants: None. IMPRESSION: 1. No acute intracranial hemorrhage. ASPECT score is 10. 2. Acute occlusion of the left MCA, mid M1 segment. Distal branches are symmetric. 3. No hemodynamically significant stenosis in the neck. Aortic Atherosclerosis (ICD10-I70.0) and Emphysema (ICD10-J43.9). Code stroke imaging results were communicated on 02/04/2023 at 9:28 pm to provider Dr. Otelia Limes via secure text paging. Electronically Signed   By: Orvan Falconer M.D.   On: 02/04/2023 21:34   CT ANGIO HEAD NECK W WO CM (CODE STROKE)  Result Date: 02/04/2023 CLINICAL DATA:  Neuro deficit, acute, stroke suspected. Right-sided weakness. EXAM: CT HEAD WITHOUT CONTRAST CT ANGIOGRAPHY OF THE HEAD AND NECK TECHNIQUE: Contiguous axial images were obtained from the base of the skull through the vertex without intravenous contrast. Multidetector CT imaging of the head and neck was performed using the standard protocol during bolus administration of intravenous contrast. Multiplanar CT image reconstructions and MIPs were obtained to evaluate the vascular anatomy. Carotid stenosis measurements (when applicable) are obtained utilizing NASCET criteria, using the distal internal carotid diameter as the denominator. RADIATION DOSE REDUCTION: This exam was performed according to the departmental dose-optimization program which includes automated exposure control, adjustment of the mA and/or kV according to patient size and/or use of iterative reconstruction technique. CONTRAST:  OMNIPAQUE IOHEXOL 350 MG/ML SOLN COMPARISON:  None Available. FINDINGS: CT HEAD Brain: No acute intracranial hemorrhage. Gray-white differentiation is preserved. No hydrocephalus or extra-axial collection. No mass effect or midline shift. ASPECT score is 10. Vascular: No hyperdense vessel or unexpected calcification. Skull: No calvarial fracture or suspicious bone lesion. Skull base is unremarkable.  Sinuses/Orbits: Unremarkable. CTA NECK Aortic arch: Three-vessel arch configuration. Atherosclerotic calcifications of the aortic arch and arch vessel origins. Arch vessel origins are patent. Right carotid system: Calcified plaque results in less than 50% stenosis of the proximal right cervical ICA. Left carotid system: Calcified plaque results in less than 50% stenosis of the proximal left cervical ICA. Vertebral arteries:Patent from the origin to the confluence with the basilar without stenosis or dissection. Skeleton: Mild cervical spondylosis without high-grade spinal canal stenosis. Other neck: Emphysema in the lung apices.  Otherwise unremarkable. CTA HEAD Anterior circulation: Calcified plaque along the carotid siphons without hemodynamically significant stenosis. Abrupt cut off of the left MCA, mid M1 segment. The right MCA and bilateral ACAs are patent without stenosis or aneurysm. Distal branches are symmetric. Posterior circulation: Normal basilar artery. The SCAs, AICAs and PICAs are patent proximally. The PCAs are patent proximally without stenosis or aneurysm. Distal branches are symmetric. Venous sinuses: Patent. Anatomic variants: None. IMPRESSION: 1. No acute intracranial hemorrhage. ASPECT score is 10. 2. Acute occlusion of the left MCA, mid M1 segment. Distal branches are symmetric. 3. No hemodynamically significant stenosis in the neck. Aortic Atherosclerosis (ICD10-I70.0) and Emphysema (ICD10-J43.9). Code stroke imaging results were communicated on 02/04/2023 at 9:28 pm to provider Dr. Otelia Limes via secure text paging. Electronically Signed   By: Orvan Falconer M.D.   On: 02/04/2023 21:34    PHYSICAL EXAM  Temp:  [98 F (36.7 C)-98.8 F (37.1 C)] 98.8 F (37.1 C) (05/14 0800) Pulse Rate:  [62-105] 68 (05/14 1300)  Resp:  [15-24] 24 (05/14 1300) BP: (92-150)/(56-105) 99/87 (05/14 1300) SpO2:  [91 %-98 %] 94 % (05/14 1300) Arterial Line BP: (94-157)/(50-85) 122/58 (05/14 1300) Weight:   [64.2 kg] 64.2 kg (05/13 2000)  General - Well nourished, well developed, in no apparent distress. Cardiovascular - Regular rhythm and rate.  Mental Status -  Level of arousal and orientation to time, place, and person were intact. Language including expression, naming, repetition, comprehension was assessed and found intact. Attention span and concentration were normal. Recent and remote memory were intact. Fund of Knowledge was assessed and was intact.  Cranial Nerves II - XII - II - Visual field intact OU. III, IV, VI - Extraocular movements intact. V - Facial sensation intact bilaterally. VII - slight right facial droop VIII - Hearing & vestibular intact bilaterally. X - Palate elevates symmetrically. XI - Chin turning & shoulder shrug intact bilaterally. XII - Tongue protrusion intact.  Motor Strength - The patient's strength was normal in all extremities and pronator drift was absent.  Bulk was normal and fasciculations were absent.   Motor Tone - Muscle tone was assessed at the neck and appendages and was normal.  Sensory - Light touch, temperature/pinprick were assessed and were symmetrical.    Coordination - The patient had normal movements in the hands and feet with no ataxia or dysmetria.  Tremor was absent.  Gait and Station - deferred.  ASSESSMENT/PLAN Ms. TRENEE MATTE is a 77 y.o. female with history of arthritis, anxiety, COPD, depression, HLD, HTN, prediabetes and tobacco abuse who presents to the ED from home via EMS as a Code Stroke after acute onset of right sided weakness and aphasia while at home with family this evening. LKN was 8:00 PM when she was watching TV with her family. Family left the room and when they came back she was on the floor and unable to speak.   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 ASPECTS 10.     CTA head & neck Acute occlusion of the left MCA, mid M1 segment  Cerebral angio Occluded left M1 segment s/p TICI3 Post IR CT no hemorrhage  MRI  pending [place] groin left groin hematoma, no pseudoaneurysm.  2D Echo EF 60-65% [place] BLE no DVT Recommend loop recorder prior to discharge  LDL 66 HgbA1c 5.0 VTE prophylaxis - SCD's aspirin 81 mg daily prior to admission, now on No antithrombotic within 24h of TNK  Therapy recommendations:  pending  Disposition:  pending   Hypertension Home meds:  HCTZ 25 mg, losartan 50mg ,  Stable BP goal after TNK <180/105 Long-term BP goal normotensive  Hyperlipidemia Home meds:  Crestor 40mg , resumed in hospital LDL 66, goal < 70 Continue statin at discharge  Tobacco Use  Encourage cessation  Offered nicotine patch- refused currently   Other Stroke Risk Factors Advanced Age >/= 65   Other Active Problems COPD Anxiety/depression   Hospital day # 1  Gevena Mart DNP, ACNPC-AG  [facility]  ATTENDING NOTE: I reviewed above note and agree with the assessment and plan. Pt was seen and examined.   No family at bedside.  Patient lying in bed, neuro intact except right facial droop, moving all extremities, AOx3.  Pending MRI and MRA.  EF 60 to 65%, LE venous Doppler negative for DVT.  Etiology unclear for stroke, concerning for occult A-fib, recommend  loop recorder prior to discharge.  Consider DAPT after MRI tonight if no bleeding.  Continue home Crestor 40.  PT and OT pending.  For detailed assessment and plan, please refer to above/below as I have made changes wherever appropriate.   Marvel Plan, MD PhD Stroke Neurology 02/05/2023 4:52 PM  This patient is critically ill due to left MCA stroke status post TNK and thrombectomy, groin hematoma and at significant risk of neurological worsening, death form recurrent stroke, hemorrhagic transformation, bleeding from TNK, groin pseudoaneurysm. This patient's care requires constant  monitoring of vital signs, hemodynamics, respiratory and cardiac monitoring, review of multiple databases, neurological assessment, discussion with family, other specialists and medical decision making of high complexity. I spent 35 minutes of neurocritical care time in the care of this patient.   To contact Stroke Continuity provider, please refer to [email or web address]. After hours, contact General Neurology

## 2023-02-05 NOTE — Anesthesia Postprocedure Evaluation (Addendum)
Anesthesia Post Note  Patient: Teresa Ford  Procedure(s) Performed: RADIOLOGY WITH ANESTHESIA     Patient location during evaluation: ICU Anesthesia Type: General Level of consciousness: awake Pain management: pain level controlled Vital Signs Assessment: post-procedure vital signs reviewed and stable Respiratory status: spontaneous breathing, nonlabored ventilation and respiratory function stable Cardiovascular status: blood pressure returned to baseline and stable Postop Assessment: no apparent nausea or vomiting Anesthetic complications: no  No notable events documented.  Last Vitals:  Vitals:   02/05/23 0045 02/05/23 0100  BP: 120/82 128/63  Pulse: 72 68  Resp: 15 19  SpO2: 96% 96%    Last Pain:  Vitals:   02/05/23 0015  PainSc: 0-No pain                 Bana Borgmeyer,W. EDMOND

## 2023-02-05 NOTE — Transfer of Care (Signed)
Immediate Anesthesia Transfer of Care Note  Patient: Teresa Ford  Procedure(s) Performed: RADIOLOGY WITH ANESTHESIA  Patient Location: ICU  Anesthesia Type:General  Level of Consciousness: awake and alert   Airway & Oxygen Therapy: Patient Spontanous Breathing and Patient connected to face mask oxygen  Post-op Assessment: Report given to RN, Post -op Vital signs reviewed and stable, Patient moving all extremities X 4, and Patient able to stick tongue midline  Post vital signs: Reviewed and stable  Last Vitals:  Vitals Value Taken Time  BP 121/62 02/05/23 0010  Temp    Pulse 74 02/05/23 0010  Resp 24 02/05/23 0010  SpO2 97 % 02/05/23 0010  Vitals shown include unvalidated device data.  Last Pain: There were no vitals filed for this visit.       Complications: No notable events documented.

## 2023-02-05 NOTE — Progress Notes (Signed)
OT Cancellation Note  Patient Details Name: MCKENLY BENCOMO MRN: [id number] DOB: 04-15-1946   Cancelled Treatment:    Reason Eval/Treat Not Completed: Active bedrest order. Will check back later or proceed with OT eval on 5/15 when pt is off bedrest.    Kaspian Muccio OTR/L 02/05/2023, 3:20 PM

## 2023-02-06 ENCOUNTER — Inpatient Hospital Stay (HOSPITAL_COMMUNITY): Payer: Medicare Other

## 2023-02-06 DIAGNOSIS — I4891 Unspecified atrial fibrillation: Secondary | ICD-10-CM

## 2023-02-06 MED ORDER — ASPIRIN 81 MG PO TBEC
81.0000 mg | DELAYED_RELEASE_TABLET | Freq: Every day | ORAL | Status: DC
  Administered 2023-02-06 – 2023-02-07 (×2): 81 mg via ORAL
  Filled 2023-02-06 (×2): qty 1

## 2023-02-06 MED ORDER — PANTOPRAZOLE SODIUM 40 MG PO TBEC
40.0000 mg | DELAYED_RELEASE_TABLET | Freq: Every day | ORAL | Status: DC
  Administered 2023-02-06 – 2023-02-07 (×2): 40 mg via ORAL
  Filled 2023-02-06 (×2): qty 1

## 2023-02-06 NOTE — Evaluation (Addendum)
Physical Therapy Evaluation Patient Details Name: Teresa Ford MRN: [id number] DOB: Jan 08, 1946 Today's Date: 02/06/2023  History of Present Illness  Pt is a 77 y.o. female who present to the ED 5/13 with c/o R side weakness and aphasia. 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 She received TNK and underwent thrombectomy 5/13. PMH:  OA, anxiety, COPD, depression, HLD, HTN, prediabetes, tobacco abuse   Clinical Impression  Pt admitted with above diagnosis. PTA pt lived alone, independent and driving. Pt currently with functional limitations due to the deficits listed below (see PT Problem List). On eval, she required supervision transfers, and min guard assist amb 175' without AD. Pt in a fib with max HR 163. RR 45. SpO2 91% on RA. Pt reports all symptoms have resolved. BLE appears intact and symmetrical. Sensation intact. No facial droop noted. Speech is clear. Pt educated on [facility]FA[facility]. Pt will benefit from acute skilled PT to increase their independence and safety with mobility to allow discharge. PT to follow acutely. No follow up services or DME indicated.         Recommendations for follow up therapy are one component of a multi-disciplinary discharge planning process, led by the attending physician.  Recommendations may be updated based on patient status, additional functional criteria and insurance authorization.  Follow Up Recommendations       Assistance Recommended at Discharge PRN  Patient can return home with the following       Equipment Recommendations None recommended by PT  Recommendations for Other Services       Functional Status Assessment Patient has had a recent decline in their functional status and demonstrates the ability to make significant improvements in function in a reasonable and predictable amount of time.     Precautions / Restrictions Precautions Precautions: Fall;Other (comment) Precaution Comments: watch HR      Mobility  Bed  Mobility Overal bed mobility: Modified Independent                  Transfers Overall transfer level: Needs assistance Equipment used: None Transfers: Sit to/from Stand Sit to Stand: Supervision           General transfer comment: supervision for safety    Ambulation/Gait Ambulation/Gait assistance: Min guard Gait Distance (Feet): 175 Feet Assistive device: None Gait Pattern/deviations: Step-through pattern, Decreased stride length Gait velocity: WFL Gait velocity interpretation: 1.31 - 2.62 ft/sec, indicative of limited community ambulator   General Gait Details: mildly unsteady, no overt LOB. Pt in a fib with max HR 163. RR 45. SpO2 91% on RA.  Stairs            Wheelchair Mobility    Modified Rankin (Stroke Patients Only) Modified Rankin (Stroke Patients Only) Pre-Morbid Rankin Score: No symptoms Modified Rankin: Slight disability     Balance Overall balance assessment: Needs assistance Sitting-balance support: No upper extremity supported, Feet supported Sitting balance-Leahy Scale: Good     Standing balance support: No upper extremity supported, During functional activity Standing balance-Leahy Scale: Fair                               Pertinent Vitals/Pain Pain Assessment Pain Assessment: Faces Faces Pain Scale: Hurts a little bit Pain Location: chronic back pain Pain Descriptors / Indicators: Discomfort Pain Intervention(s): Monitored during session    Home Living Family/patient expects to be discharged to:: Private residence Living Arrangements: Alone Available Help at Discharge: Friend(s);Available PRN/intermittently;Family Type  of Home: House Home Access: Ramped entrance     Alternate Level Stairs-Number of Steps: flight Home Layout: Able to live on main level with bedroom/bathroom;Two level Home Equipment: Cane - single point      Prior Function Prior Level of Function : Independent/Modified Independent;Driving              Mobility Comments: occasional use of cane in community       Hand Dominance   Dominant Hand: Right    Extremity/Trunk Assessment   Upper Extremity Assessment Upper Extremity Assessment: Overall WFL for tasks assessed    Lower Extremity Assessment Lower Extremity Assessment: Overall WFL for tasks assessed (symmetrical, sensation intact)    Cervical / Trunk Assessment Cervical / Trunk Assessment: Kyphotic  Communication   Communication: No difficulties  Cognition Arousal/Alertness: Awake/alert Behavior During Therapy: WFL for tasks assessed/performed Overall Cognitive Status: Within Functional Limits for tasks assessed                                          General Comments General comments (skin integrity, edema, etc.): a fib. Max HR 163. RR 45. SpO2 91% on RA.    Exercises     Assessment/Plan    PT Assessment Patient needs continued PT services  PT Problem List Decreased balance;Pain;Decreased mobility;Cardiopulmonary status limiting activity;Decreased activity tolerance       PT Treatment Interventions Functional mobility training;Balance training;Patient/family education;Gait training;Therapeutic activities;Stair training;Therapeutic exercise    PT Goals (Current goals can be found in the Care Plan section)  Acute Rehab PT Goals Patient Stated Goal: home PT Goal Formulation: With patient Time For Goal Achievement: 02/20/23 Potential to Achieve Goals: Good    Frequency Min 4X/week     Co-evaluation               AM-PAC PT "6 Clicks" Mobility  Outcome Measure Help needed turning from your back to your side while in a flat bed without using bedrails?: None Help needed moving from lying on your back to sitting on the side of a flat bed without using bedrails?: None Help needed moving to and from a bed to a chair (including a wheelchair)?: A Little Help needed standing up from a chair using your arms (e.g., wheelchair  or bedside chair)?: A Little Help needed to walk in hospital room?: A Little Help needed climbing 3-5 steps with a railing? : A Little 6 Click Score: 20    End of Session Equipment Utilized During Treatment: Gait belt Activity Tolerance: Patient tolerated treatment well Patient left: in chair;with call bell/phone within reach Nurse Communication: Mobility status;Other (comment) (Pt in a fib.) PT Visit Diagnosis: Other abnormalities of gait and mobility (R26.89)    Time: 4098-1191 PT Time Calculation (min) (ACUTE ONLY): 24 min   Charges:   PT Evaluation $PT Eval Moderate Complexity: 1 Mod PT Treatments $Gait Training: 8-22 mins        Ferd Glassing., PT  Office # [phone]   Ilda Foil 02/06/2023, 8:48 AM

## 2023-02-06 NOTE — Progress Notes (Signed)
Pt transferred to room 4Np10 at this time.  Pt has no s/s of any acute distress.  No neuro deficits noted at this time.  Report given to receiving nurse.  VSS

## 2023-02-06 NOTE — Evaluation (Signed)
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   Smoker 01/13/2014   Kyphosis of thoracic region 01/13/2014   Mixed incontinence    Hyperlipidemia    Arthritis    Past Medical History:  Past Medical History:  Diagnosis Date   Anxiety    Arthritis    COPD (chronic obstructive pulmonary disease) (HCC)    Depression    Hyperlipidemia    Hypertension    Mixed incontinence 2010   Osteopenia    Overweight    Prediabetes    Renal cyst    left   Tobacco abuse    Past Surgical History:  Past Surgical History:  Procedure Laterality Date   BREAST EXCISIONAL BIOPSY Right 1972   benign   BREAST SURGERY  1972   benign tumor removed rt breast   BUNIONECTOMY  12/2002   IR CT HEAD LTD  02/04/2023   IR PERCUTANEOUS ART THROMBECTOMY/INFUSION INTRACRANIAL INC DIAG ANGIO  02/04/2023   RADIOLOGY WITH ANESTHESIA N/A 02/04/2023   Procedure: RADIOLOGY WITH ANESTHESIA;  Surgeon: Radiologist, Medication, MD;  Location: [facility] OR;  Service: Radiology;  Laterality: N/A;   VAGINAL HYSTERECTOMY  1976   HPI:  Pt is a 77 y.o. female who present 5/13 with c/o R side weakness and aphasia. 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 Pt received TNK and underwent thrombectomy 5/13. MRI brain 5/14: Patchy small volume acute left MCA distribution infarcts as above. PMH:  OA, anxiety, COPD, depression, HLD, HTN, prediabetes, tobacco abuse   Assessment / Plan / Recommendation Clinical Impression  Pt participated in speech-language evaluation which was abbreviated due to the arrival of transport to take pt to CT. Pt reported that she lives alone and is typically independent  with medication and financial management. Pt communicated at the conversational level and word retrieval difficulty was not observed during our brief interaction. She followed 1 and 2 step commands without difficulty and attended to tasks without support. Articulatory imprecision was noted, but pt speech was intelligible and pt denied any acute change in her speech. Pt reported that she believes her communication skills have returned to baseline, but stated that she would be amenable to have the SLP's evaluation completed on a subsequent date.    SLP Assessment  SLP Recommendation/Assessment: Patient needs continued Speech Lanaguage Pathology Services SLP Visit Diagnosis: Aphasia (R47.01) (Further SLP assessment is warranted.)    Recommendations for follow up therapy are one component of a multi-disciplinary discharge planning process, led by the attending physician.  Recommendations may be updated based on patient status, additional functional criteria and insurance authorization.    Follow Up Recommendations       Assistance Recommended at Discharge     Functional Status Assessment    Frequency and Duration min 1 x/week  1 week      SLP Evaluation Cognition  Overall Cognitive Status:  (Further assessment warranted) Arousal/Alertness: Awake/alert Attention: Focused;Sustained Focused Attention: Appears intact Sustained Attention: Appears intact       Comprehension  Auditory Comprehension Yes/No Questions: Within Functional Limits Commands: Within Functional Limits Conversation: Complex    Expression Expression Primary Mode of Expression: Verbal Verbal Expression Initiation: No impairment Level of Generative/Spontaneous Verbalization: Conversation Repetition:  No impairment Written Expression Dominant Hand: Right   Oral / Motor  Oral Motor/Sensory Function Overall Oral Motor/Sensory Function: Within functional limits Motor Speech Respiration: Within functional  limits Phonation: Normal Resonance: Within functional limits Articulation: Impaired Level of Impairment: Conversation Intelligibility: Intelligible Motor Planning: Witnin functional limits           Zala Degrasse I. Vear Clock, MS, CCC-SLP Acute Rehabilitation Services Office number [phone]  Scheryl Marten 02/06/2023, 3:42 PM

## 2023-02-06 NOTE — Progress Notes (Signed)
MRI complete @ 2110. MD notified MRI complete, no new orders at the time, except nicotine patch.   Spoke to MD @ [phone] to see if patient could transfer to 3[facility], also asked if ASA needed to be ordered, orders for transfer and ASA verbal.

## 2023-02-06 NOTE — Evaluation (Signed)
Occupational Therapy Evaluation Patient Details Name: Teresa Ford MRN: [id number] DOB: Feb 11, 1946 Today's Date: 02/06/2023   History of Present Illness Pt is a 77 y.o. female who present to the ED 5/13 with c/o R side weakness and aphasia. 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 She received TNK and underwent thrombectomy 5/13. PMH:  OA, anxiety, COPD, depression, HLD, HTN, prediabetes, tobacco abuse   Clinical Impression   Prior to this admission, patient was living independently, driving, managing her own medications and finances independently and occasionally walking with a cane for long distances. Currently, patient presenting nearly back to her baseline with Afib into 170s with ambulation, and minor deficits noted in upper level cognition. OT to perform pillbox assessment next session, however daughter will be staying with her for the first few weeks after discharge to ensure safety. Patient in agreement not to drive until she feels fully back to her baseline, and education provided on BE FAST acronym. OT will follow acutely, with no need for OT follow up.     Recommendations for follow up therapy are one component of a multi-disciplinary discharge planning process, led by the attending physician.  Recommendations may be updated based on patient status, additional functional criteria and insurance authorization.   Assistance Recommended at Discharge Frequent or constant Supervision/Assistance (initially)  Patient can return home with the following Direct supervision/assist for medications management;Direct supervision/assist for financial management;Assist for transportation;Assistance with cooking/housework    Functional Status Assessment  Patient has had a recent decline in their functional status and demonstrates the ability to make significant improvements in function in a reasonable and predictable amount of time.  Equipment Recommendations  None recommended by OT     Recommendations for Other Services       Precautions / Restrictions Precautions Precautions: Fall;Other (comment) Precaution Comments: watch HR Restrictions Weight Bearing Restrictions: No      Mobility Bed Mobility               General bed mobility comments: up in recliner upon arrival    Transfers Overall transfer level: Needs assistance Equipment used: None Transfers: Sit to/from Stand Sit to Stand: Supervision           General transfer comment: supervision for safety      Balance Overall balance assessment: Needs assistance Sitting-balance support: No upper extremity supported, Feet supported Sitting balance-Leahy Scale: Good     Standing balance support: No upper extremity supported, During functional activity Standing balance-Leahy Scale: Fair                             ADL either performed or assessed with clinical judgement   ADL Overall ADL's : At baseline                                       General ADL Comments: Patient back to baseline for ADL tasks     Vision Baseline Vision/History: 1 Wears glasses Ability to See in Adequate Light: 0 Adequate Patient Visual Report: No change from baseline Vision Assessment?: Yes Eye Alignment: Within Functional Limits Ocular Range of Motion: Within Functional Limits Alignment/Gaze Preference: Within Defined Limits Tracking/Visual Pursuits: Able to track stimulus in all quads without difficulty Saccades: Within functional limits Convergence: Within functional limits Visual Fields: No apparent deficits     Perception     Praxis  Pertinent Vitals/Pain Pain Assessment Pain Assessment: Faces Faces Pain Scale: Hurts a little bit Pain Location: chronic back pain Pain Descriptors / Indicators: Discomfort Pain Intervention(s): Limited activity within patient's tolerance, Monitored during session, Repositioned     Hand Dominance Right   Extremity/Trunk  Assessment Upper Extremity Assessment Upper Extremity Assessment: Overall WFL for tasks assessed   Lower Extremity Assessment Lower Extremity Assessment: Defer to PT evaluation   Cervical / Trunk Assessment Cervical / Trunk Assessment: Kyphotic   Communication Communication Communication: No difficulties   Cognition Arousal/Alertness: Awake/alert Behavior During Therapy: WFL for tasks assessed/performed Overall Cognitive Status: Within Functional Limits for tasks assessed                                 General Comments: only recalled 2/3 words after functional mobility task, if here tomorrow will assess with pillbox test     General Comments  Afib, HR up to 170 with ambulation    Exercises     Shoulder Instructions      Home Living Family/patient expects to be discharged to:: Private residence Living Arrangements: Alone Available Help at Discharge: Friend(s);Available PRN/intermittently;Family Type of Home: House Home Access: Ramped entrance     Home Layout: Able to live on main level with bedroom/bathroom;Two level Alternate Level Stairs-Number of Steps: flight       Bathroom Toilet: Standard     Home Equipment: Cane - single point          Prior Functioning/Environment Prior Level of Function : Independent/Modified Independent;Driving             Mobility Comments: occasional use of cane in community ADLs Comments: independent        OT Problem List: Decreased activity tolerance;Decreased cognition      OT Treatment/Interventions: Cognitive remediation/compensation;Balance training;Patient/family education;Energy conservation;Self-care/ADL training    OT Goals(Current goals can be found in the care plan section) Acute Rehab OT Goals Patient Stated Goal: to get back home and sleep in my own bed OT Goal Formulation: With patient Time For Goal Achievement: 02/20/23 Potential to Achieve Goals: Good  OT Frequency: Min 2X/week     Co-evaluation              AM-PAC OT "6 Clicks" Daily Activity     Outcome Measure Help from another person eating meals?: None Help from another person taking care of personal grooming?: None Help from another person toileting, which includes using toliet, bedpan, or urinal?: None Help from another person bathing (including washing, rinsing, drying)?: None Help from another person to put on and taking off regular upper body clothing?: None Help from another person to put on and taking off regular lower body clothing?: None 6 Click Score: 24   End of Session Nurse Communication: Mobility status;Other (comment) (HR Afib)  Activity Tolerance: Patient tolerated treatment well Patient left: in chair;with call bell/phone within reach  OT Visit Diagnosis: Unsteadiness on feet (R26.81)                Time: 1610-9604 OT Time Calculation (min): 19 min Charges:  OT General Charges $OT Visit: 1 Visit OT Evaluation $OT Eval Moderate Complexity: 1 Mod  Pollyann Glen E. Takeia Ciaravino, OTR/L Acute Rehabilitation Services [phone]   Cherlyn Cushing 02/06/2023, 12:13 PM

## 2023-02-06 NOTE — Care Management Important Message (Signed)
Important Message  Patient Details  Name: CONSUELLO UNDERHILL MRN: [id number] Date of Birth: 06-16-1946   Medicare Important Message Given:  Yes     Sherilyn Banker 02/06/2023, 11:39 AM

## 2023-02-06 NOTE — Progress Notes (Addendum)
STROKE TEAM PROGRESS NOTE   INTERVAL HISTORY No family at bedside.  RN at bedside.  EKG done this morning shows atrial fibrillation.  A-fib on monitor shows controlled rate.  Patient states she has never been aware that she has had A-fib before.   Closest relative is daughter who lives in [place] she is on her way here today. Neurological exam stable, slight right facial droop continues.  Repeat CT scheduled for this afternoon.  If this is stable we will start Eliquis tonight and possible discharge tomorrow.   Vitals:   02/06/23 0748 02/06/23 0756 02/06/23 0800 02/06/23 1125  BP: 112/69 119/67  (!) 145/81  Pulse:  97  96  Resp: 19 20  18   Temp: 97.7 F (36.5 C) 98.5 F (36.9 C) 98.3 F (36.8 C) 98 F (36.7 C)  TempSrc: Oral Oral  Oral  SpO2:  92%  94%  Weight:       CBC:  Recent Labs  Lab 02/04/23 2054 02/04/23 2103 02/05/23 0618  WBC 5.8  --  8.4  NEUTROABS 3.3  --  7.9*  HGB 12.1 11.6* 11.9*  HCT 35.0* 34.0* 34.3*  MCV 101.2*  --  100.3*  PLT 217  --  206    Basic Metabolic Panel:  Recent Labs  Lab 02/04/23 2054 02/04/23 2103 02/05/23 0618  NA 137 140 136  K 2.8* 2.9* 4.0  CL 105 104 105  CO2 21*  --  23  GLUCOSE 117* 112* 229*  BUN 21 22 23   CREATININE 0.87 1.00 0.91  CALCIUM 7.8*  --  8.6*    Lipid Panel:  Recent Labs  Lab 02/05/23 0618  CHOL 126  TRIG 28  HDL 54  CHOLHDL 2.3  VLDL 6  LDLCALC 66    HgbA1c:  Recent Labs  Lab 02/05/23 0618  HGBA1C 5.0    Urine Drug Screen: No results for input(s): "LABOPIA", "COCAINSCRNUR", "LABBENZ", "AMPHETMU", "THCU", "LABBARB" in the last 168 hours.  Alcohol Level  Recent Labs  Lab 02/04/23 2054  ETH 100*     IMAGING past 24 hours MR BRAIN WO CONTRAST  Result Date: 02/05/2023 CLINICAL DATA:  Follow-up examination for stroke. EXAM: MRI HEAD WITHOUT CONTRAST MRA HEAD WITHOUT CONTRAST TECHNIQUE: Multiplanar, multi-echo pulse sequences of the brain and surrounding structures were acquired without  intravenous contrast. Angiographic images of the Circle of Willis were acquired using MRA technique without intravenous contrast. COMPARISON:  Prior studies from 02/04/2023. FINDINGS: MRI HEAD FINDINGS Brain: Cerebral volume within normal limits for age. Patchy T2/FLAIR hyperintensity involving the periventricular and deep white matter both cerebral hemispheres, most likely related chronic microvascular ischemic disease, mild for age. Few scattered subtle foci of diffusion signal abnormality are seen involving the left MCA distribution. Involvement of the left basal ganglia (series 2, images 33, 29). Minimal patchy cortical involvement at the overlying left frontal and temporal lobes (series 2, images 35, 27). Scattered superimposed FLAIR hyperintensity with susceptibility artifact present in this region (series 7, images 20). Additional signal abnormality noted at the level of the left MCA bifurcation (series 8, image 42). Findings highly suspicious for superimposed small volume acute subarachnoid hemorrhage. Probable trace read distributed blood at the right occipital horn noted (series 8, image 50). No other evidence for acute or subacute ischemia. No other acute or chronic intracranial blood products. No mass lesion or midline shift. No hydrocephalus or extra-axial fluid collection. Pituitary gland and suprasellar region within normal limits. Vascular: Major intracranial vascular flow voids are maintained. Skull and  upper cervical spine: Craniocervical junction within normal limits. Bone marrow signal intensity normal. No scalp soft tissue abnormality. Sinuses/Orbits: Globes and orbital soft tissues within normal limits. Paranasal sinuses are largely clear. No mastoid effusion. Other: None. MRA HEAD FINDINGS Anterior circulation: Mild atheromatous irregularity about the carotid siphons without hemodynamically significant stenosis. A1 segments patent bilaterally. Normal anterior communicating complex. Anterior  cerebral arteries patent without stenosis. Interval revascularization of previously seen left M1 occlusion. Left M1 segment now widely patent. Right M1 remains patent. No proximal MCA branch occlusion or high-grade stenosis. Distal MCA branches perfused and fairly symmetric. Posterior circulation: Both vertebral arteries patent without stenosis. Left vertebral artery slightly dominant. Both PICA patent. Basilar patent without stenosis. Superior cerebral arteries patent bilaterally. Both PCAs primarily supplied via the basilar well perfused or distal aspects. Prominent left posterior communicating artery noted. Anatomic variants: None significant.  No aneurysm. IMPRESSION: MRI HEAD IMPRESSION: 1. 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 No significant mass effect. 2. Probable scattered small volume acute subarachnoid hemorrhage about the left sylvian fissure. 3. Underlying mild chronic microvascular ischemic disease. MRA HEAD IMPRESSION: 1. Interval revascularization of previously seen left M1 occlusion. Left M1 segment now widely patent. No large vessel occlusion as seen. 2. Mild intracranial atherosclerotic disease, primarily involving the carotid siphons. No hemodynamically significant or correctable stenosis. These results were communicated to Dr. Otelia Limes at 9:33 pm on 02/05/2023 by text page via the [facility] messaging system. Electronically Signed   By: Rise Mu M.D.   On: 02/05/2023 21:35   MR ANGIO HEAD WO CONTRAST  Result Date: 02/05/2023 CLINICAL DATA:  Follow-up examination for stroke. EXAM: MRI HEAD WITHOUT CONTRAST MRA HEAD WITHOUT CONTRAST TECHNIQUE: Multiplanar, multi-echo pulse sequences of the brain and surrounding structures were acquired without intravenous contrast. Angiographic images of the Circle of Willis were acquired using MRA technique without intravenous contrast. COMPARISON:  Prior studies from 02/04/2023. FINDINGS: MRI HEAD FINDINGS Brain: Cerebral  volume within normal limits for age. Patchy T2/FLAIR hyperintensity involving the periventricular and deep white matter both cerebral hemispheres, most likely related chronic microvascular ischemic disease, mild for age. Few scattered subtle foci of diffusion signal abnormality are seen involving the left MCA distribution. Involvement of the left basal ganglia (series 2, images 33, 29). Minimal patchy cortical involvement at the overlying left frontal and temporal lobes (series 2, images 35, 27). Scattered superimposed FLAIR hyperintensity with susceptibility artifact present in this region (series 7, images 20). Additional signal abnormality noted at the level of the left MCA bifurcation (series 8, image 42). Findings highly suspicious for superimposed small volume acute subarachnoid hemorrhage. Probable trace read distributed blood at the right occipital horn noted (series 8, image 50). No other evidence for acute or subacute ischemia. No other acute or chronic intracranial blood products. No mass lesion or midline shift. No hydrocephalus or extra-axial fluid collection. Pituitary gland and suprasellar region within normal limits. Vascular: Major intracranial vascular flow voids are maintained. Skull and upper cervical spine: Craniocervical junction within normal limits. Bone marrow signal intensity normal. No scalp soft tissue abnormality. Sinuses/Orbits: Globes and orbital soft tissues within normal limits. Paranasal sinuses are largely clear. No mastoid effusion. Other: None. MRA HEAD FINDINGS Anterior circulation: Mild atheromatous irregularity about the carotid siphons without hemodynamically significant stenosis. A1 segments patent bilaterally. Normal anterior communicating complex. Anterior cerebral arteries patent without stenosis. Interval revascularization of previously seen left M1 occlusion. Left M1 segment now widely patent. Right M1 remains patent. No proximal MCA branch occlusion or  high-grade  stenosis. Distal MCA branches perfused and fairly symmetric. Posterior circulation: Both vertebral arteries patent without stenosis. Left vertebral artery slightly dominant. Both PICA patent. Basilar patent without stenosis. Superior cerebral arteries patent bilaterally. Both PCAs primarily supplied via the basilar well perfused or distal aspects. Prominent left posterior communicating artery noted. Anatomic variants: None significant.  No aneurysm. IMPRESSION: MRI HEAD IMPRESSION: 1. 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 No significant mass effect. 2. Probable scattered small volume acute subarachnoid hemorrhage about the left sylvian fissure. 3. Underlying mild chronic microvascular ischemic disease. MRA HEAD IMPRESSION: 1. Interval revascularization of previously seen left M1 occlusion. Left M1 segment now widely patent. No large vessel occlusion as seen. 2. Mild intracranial atherosclerotic disease, primarily involving the carotid siphons. No hemodynamically significant or correctable stenosis. These results were communicated to Dr. Otelia Limes at 9:33 pm on 02/05/2023 by text page via the [facility] messaging system. Electronically Signed   By: Rise Mu M.D.   On: 02/05/2023 21:35   VAS [place] GROIN PSEUDOANEURYSM  Result Date: 02/05/2023  ARTERIAL PSEUDOANEURYSM  Patient Name:  Teresa Ford  Date of Exam:   02/05/2023 Medical Rec #: [id number]           Accession #:    [id number] Date of Birth: [birth year]           Patient Gender: F Patient Age:   70 years Exam Location:  [facility] Procedure:      VAS [place] Bobetta Lime Referring Phys: Lawernce Ion --------------------------------------------------------------------------------  Exam: Right groin Indications: Patient complains of bruising and bleeding. History: S/p catheterization. Comparison Study: No prior studies. Performing Technologist: Jean Rosenthal RDMS, RVT  Examination Guidelines: A complete evaluation  includes B-mode imaging, spectral Doppler, color Doppler, and power Doppler as needed of all accessible portions of each vessel. Bilateral testing is considered an integral part of a complete examination. Limited examinations for reoccurring indications may be performed as noted. +------------+----------+--------+------+--------------------+ Right DuplexPSV (cm/s)WaveformPlaque     Comment(s)      +------------+----------+--------+------+--------------------+ CFA            281                  Stenotic mid segment +------------+----------+--------+------+--------------------+ PFA            334                   Stenotic at origin  +------------+----------+--------+------+--------------------+ Prox SFA       174                                       +------------+----------+--------+------+--------------------+  Findings: A mixed echogenic structure measuring approximately 2.4 cm x 1.8 cm is visualized at the at the level of the mid common femoral artery with ultrasound characteristics of a hematoma. Arterial disease noted incidentally.  Diagnosing physician: Waverly Ferrari MD Electronically signed by Waverly Ferrari MD on 02/05/2023 at 6:12:07 PM.    --------------------------------------------------------------------------------    Final    VAS [place] LOWER EXTREMITY VENOUS (DVT)  Result Date: 02/05/2023  Lower Venous DVT Study Patient Name:  Teresa Ford  Date of Exam:   02/05/2023 Medical Rec #: [id number]           Accession #:    [id number] Date of Birth: 1945-09-30           Patient Gender: F Patient Age:  77 years Exam Location:  [facility] Procedure:      VAS [place] LOWER EXTREMITY VENOUS (DVT) Referring Phys: Scheryl Marten Janylah Belgrave --------------------------------------------------------------------------------  Indications: Embolic stroke.  Comparison Study: No prior studies. Performing Technologist: Jean Rosenthal RDMS, RVT  Examination Guidelines: A complete evaluation  includes B-mode imaging, spectral Doppler, color Doppler, and power Doppler as needed of all accessible portions of each vessel. Bilateral testing is considered an integral part of a complete examination. Limited examinations for reoccurring indications may be performed as noted. The reflux portion of the exam is performed with the patient in reverse Trendelenburg.  +---------+---------------+---------+-----------+----------+--------------+ RIGHT    CompressibilityPhasicitySpontaneityPropertiesThrombus Aging +---------+---------------+---------+-----------+----------+--------------+ CFV      Full           Yes      Yes                                 +---------+---------------+---------+-----------+----------+--------------+ SFJ      Full                                                        +---------+---------------+---------+-----------+----------+--------------+ FV Prox  Full                                                        +---------+---------------+---------+-----------+----------+--------------+ FV Mid   Full                                                        +---------+---------------+---------+-----------+----------+--------------+ FV DistalFull                                                        +---------+---------------+---------+-----------+----------+--------------+ PFV      Full                                                        +---------+---------------+---------+-----------+----------+--------------+ POP      Full           Yes      Yes                                 +---------+---------------+---------+-----------+----------+--------------+ PTV      Full                                                        +---------+---------------+---------+-----------+----------+--------------+ PERO     Full                                                         +---------+---------------+---------+-----------+----------+--------------+   +---------+---------------+---------+-----------+----------+--------------+  LEFT     CompressibilityPhasicitySpontaneityPropertiesThrombus Aging +---------+---------------+---------+-----------+----------+--------------+ CFV      Full           Yes      Yes                                 +---------+---------------+---------+-----------+----------+--------------+ SFJ      Full                                                        +---------+---------------+---------+-----------+----------+--------------+ FV Prox  Full                                                        +---------+---------------+---------+-----------+----------+--------------+ FV Mid   Full                                                        +---------+---------------+---------+-----------+----------+--------------+ FV DistalFull                                                        +---------+---------------+---------+-----------+----------+--------------+ PFV      Full                                                        +---------+---------------+---------+-----------+----------+--------------+ POP      Full           Yes      Yes                                 +---------+---------------+---------+-----------+----------+--------------+ PTV      Full                                                        +---------+---------------+---------+-----------+----------+--------------+ PERO     Full                                                        +---------+---------------+---------+-----------+----------+--------------+     Summary: RIGHT: - There is no evidence of deep vein thrombosis in the lower extremity.  - No cystic structure found in the popliteal fossa.  LEFT: - There is no evidence of deep vein thrombosis in the lower extremity.  - No cystic  structure found in the popliteal fossa.   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 PSV 545 cm/s  *See table(s) above for measurements and observations. Electronically signed by Waverly Ferrari MD on 02/05/2023 at 6:11:57 PM.    Final     PHYSICAL EXAM  Temp:  [97.7 F (36.5 C)-98.5 F (36.9 C)] 98 F (36.7 C) (05/15 1125) Pulse Rate:  [70-121] 96 (05/15 1125) Resp:  [16-26] 18 (05/15 1125) BP: (85-145)/(45-87) 145/81 (05/15 1125) SpO2:  [91 %-96 %] 94 % (05/15 1125)  General - Well nourished, well developed, in no apparent distress. Cardiovascular - Regular rhythm and rate.  Mental Status -  Level of arousal and orientation to time, place, and person were intact. Language including expression, naming, repetition, comprehension was assessed and found intact. Attention span and concentration were normal. Recent and remote memory were intact. Fund of Knowledge was assessed and was intact.  Cranial Nerves II - XII - II - Visual field intact OU. III, IV, VI - Extraocular movements intact. V - Facial sensation intact bilaterally. VII - slight right facial droop VIII - Hearing & vestibular intact bilaterally. X - Palate elevates symmetrically. XI - Chin turning & shoulder shrug intact bilaterally. XII - Tongue protrusion intact.  Motor Strength - The patient's strength was normal in all extremities and pronator drift was absent.  Bulk was normal and fasciculations were absent.   Motor Tone - Muscle tone was assessed at the neck and appendages and was normal.  Sensory - Light touch, temperature/pinprick were assessed and were symmetrical.    Coordination - The patient had normal movements in the hands and feet with no ataxia or dysmetria.  Tremor was absent.  Gait and Station - deferred.  ASSESSMENT/PLAN Teresa Ford is a 77 y.o. female with history of arthritis, anxiety, COPD, depression, HLD, HTN, prediabetes and tobacco abuse who presents to the ED from home via EMS as a  Code Stroke after acute onset of right sided weakness and aphasia. LKN was 8:00 PM when she was watching TV with friends; they left the room and when they came back she was on the floor and unable to speak.   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 ASPECTS 10.    CTA head & neck Acute occlusion of the left MCA, mid M1 segment  Cerebral angio Occluded left M1 segment s/p TICI3 Post IR CT no hemorrhage  MRI/MRA Patchy small volume acute left MCA distribution infarcts. No significant mass effect Probable scattered small volume acute subarachnoid hemorrhage about the left sylvian fissure Interval revascularization of previously seen left M1 occlusion. Left M1 segment now widely patent.  5/15 repeat CT Small focus of hemorrhage in the anterior left temporal lobe similar to the 02/05/2023 MRI. Additional trace SAH in the posterior left frontal sulci is also more apparent on the prior MRI. [place] groin left groin hematoma, no pseudoaneurysm.  2D Echo EF 60-65% [place] BLE no DVT LDL 66 HgbA1c 5.0 VTE prophylaxis - SCD's aspirin 81 mg daily prior to admission, now on aspirin 81 mg. Plan to start eliquis tomorrow  Therapy recommendations: No follow-up is needed Disposition:  pending   Afib New diagnosis Captured on tele and EKG Now on ASA Plan to start eliquis tomorrow given the small hemorrhagic conversion on CT Occasional RVR, consider cardiology  consult if needed.  Hypertension Home meds:  HCTZ 25 mg, losartan 50mg ,  Stable BP goal <180/105 Long-term BP goal normotensive  Hyperlipidemia Home meds:  Crestor 40mg , resumed in hospital LDL 66, goal < 70 On crestor 40 Continue statin at discharge  Tobacco Use  Encourage cessation  Nicotine patch in place Pt is wiling to quit  Other Stroke Risk Factors Advanced Age >/= 51   Other Active  Problems COPD Anxiety/depression   Hospital day # 2  Marvel Plan, MD PhD Stroke Neurology 02/06/2023 7:06 PM    To contact Stroke Continuity provider, please refer to [email or web address]. After hours, contact General Neurology

## 2023-02-07 DIAGNOSIS — I10 Essential (primary) hypertension: Secondary | ICD-10-CM

## 2023-02-07 DIAGNOSIS — I480 Paroxysmal atrial fibrillation: Secondary | ICD-10-CM

## 2023-02-07 LAB — TSH: TSH: 1.35 u[IU]/mL (ref 0.350–4.500)

## 2023-02-07 LAB — MAGNESIUM: Magnesium: 1.5 mg/dL — ABNORMAL LOW (ref 1.7–2.4)

## 2023-02-07 MED ORDER — DILTIAZEM HCL-DEXTROSE 125-5 MG/125ML-% IV SOLN (PREMIX)
5.0000 mg/h | INTRAVENOUS | Status: AC
  Administered 2023-02-07 – 2023-02-08 (×2): 5 mg/h via INTRAVENOUS
  Filled 2023-02-07 (×2): qty 125

## 2023-02-07 MED ORDER — DILTIAZEM LOAD VIA INFUSION
15.0000 mg | Freq: Once | INTRAVENOUS | Status: AC
  Administered 2023-02-07: 15 mg via INTRAVENOUS
  Filled 2023-02-07: qty 15

## 2023-02-07 NOTE — Progress Notes (Addendum)
STROKE TEAM PROGRESS NOTE   INTERVAL HISTORY Her daughter is at the bedside.  Neuroexam consistent with slight improvement to right facial droop  Patient placed on Cardizem drip this morning after converting to A-fib with RVR overnight cardiology was consulted, agrees with Cardizem drip for now and to change to p.o. tomorrow if heart rate continues to be controlled with Cardizem.    If neuroexam continues to be stable throughout the day, will start Eliquis tin the AM.  Repeat CT done yesterday showed stable bleed.   Probable discharge tomorrow. Teresa Ford confirmed that one of her daughters will be living with her for the next few months.   Vitals:   02/07/23 0745 02/07/23 1100 02/07/23 1200 02/07/23 1316  BP:  109/82 96/81   Pulse: (!) 101  84 (!) 122  Resp:   18   Temp:   97.9 F (36.6 C)   TempSrc:   Oral   SpO2: 95%  96%   Weight:       CBC:  Recent Labs  Lab 02/04/23 2054 02/04/23 2103 02/05/23 0618  WBC 5.8  --  8.4  NEUTROABS 3.3  --  7.9*  HGB 12.1 11.6* 11.9*  HCT 35.0* 34.0* 34.3*  MCV 101.2*  --  100.3*  PLT 217  --  206    Basic Metabolic Panel:  Recent Labs  Lab 02/04/23 2054 02/04/23 2103 02/05/23 0618 02/07/23 1155  NA 137 140 136  --   K 2.8* 2.9* 4.0  --   CL 105 104 105  --   CO2 21*  --  23  --   GLUCOSE 117* 112* 229*  --   BUN 21 22 23   --   CREATININE 0.87 1.00 0.91  --   CALCIUM 7.8*  --  8.6*  --   MG  --   --   --  1.5*    Lipid Panel:  Recent Labs  Lab 02/05/23 0618  CHOL 126  TRIG 28  HDL 54  CHOLHDL 2.3  VLDL 6  LDLCALC 66    HgbA1c:  Recent Labs  Lab 02/05/23 0618  HGBA1C 5.0    Urine Drug Screen: No results for input(s): "LABOPIA", "COCAINSCRNUR", "LABBENZ", "AMPHETMU", "THCU", "LABBARB" in the last 168 hours.  Alcohol Level  Recent Labs  Lab 02/04/23 2054  ETH 100*     IMAGING past 24 hours CT HEAD WO CONTRAST ( )  Result Date: 02/06/2023 CLINICAL DATA:  Stroke, follow-up after thrombectomy EXAM:  CT HEAD WITHOUT CONTRAST TECHNIQUE: Contiguous axial images were obtained from the base of the skull through the vertex without intravenous contrast. RADIATION DOSE REDUCTION: This exam was performed according to the departmental dose-optimization program which includes automated exposure control, adjustment of the mA and/or kV according to patient size and/or use of iterative reconstruction technique. COMPARISON:  02/04/2023 CT head and 02/04/2023 IR CT head, correlation is also made with MRI head 02/05/2023 FINDINGS: Brain: Small focus of hyperdensity in the anterior left temporal lobe (series 3, image 13), consistent with the small focus of hemorrhage noted on the prior MRI. Additional trace hyperdensity in the in the posterior left frontal sulci, likely trace subarachnoid hemorrhage (series 3, image 17), which is more apparent on the prior MRI the small focus of intraventricular hemorrhage noted on the MRI is not apparent on CT. Gray-white differentiation is preserved. A small area of hypodensity in the posterior left lentiform nucleus (series 3, image 17), may correlate with the left basal ganglia infarct seen on  MRI. The other acute infarcts are not apparent. No mass effect or midline shift. No hydrocephalus. Vascular: No hyperdense vessel. Skull: Negative for fracture or focal lesion. Sinuses/Orbits: No acute finding. Other: The mastoid air cells are well aerated. IMPRESSION: 1. Small focus of hemorrhage in the anterior left temporal lobe, which appears similar to the 02/05/2023 MRI, when accounting for differences in technique. Additional trace subarachnoid hemorrhage in the posterior left frontal sulci is also more apparent on the prior MRI. 2. A small area of hypodensity in the posterior left lentiform nucleus, may correlate with the left basal ganglia infarct seen on the prior MRI. The other acute infarcts are not apparent on CT. No mass effect or midline shift. Electronically Signed   By: Wiliam Ke  M.D.   On: 02/06/2023 15:56    PHYSICAL EXAM  Temp:  [97.9 F (36.6 C)-98.8 F (37.1 C)] 97.9 F (36.6 C) (05/16 1200) Pulse Rate:  [84-131] 122 (05/16 1316) Resp:  [18-20] 18 (05/16 1200) BP: (96-137)/(72-86) 96/81 (05/16 1200) SpO2:  [95 %-97 %] 96 % (05/16 1200)  General - Well nourished, well developed, in no apparent distress. Cardiovascular - Regular rhythm and rate.  Mental Status -  Level of arousal and orientation to time, place, and person were intact. Language including expression, naming, repetition, comprehension was assessed and found intact. Attention span and concentration were normal. Recent and remote memory were intact. Fund of Knowledge was assessed and was intact.  Cranial Nerves II - XII - II - Visual field intact OU. III, IV, VI - Extraocular movements intact. V - Facial sensation intact bilaterally. VII - slight right facial droop VIII - Hearing & vestibular intact bilaterally. X - Palate elevates symmetrically. XI - Chin turning & shoulder shrug intact bilaterally. XII - Tongue protrusion intact.  Motor Strength - The patient's strength was normal in all extremities and pronator drift was absent.  Bulk was normal and fasciculations were absent.   Motor Tone - Muscle tone was assessed at the neck and appendages and was normal.  Sensory - Light touch, temperature/pinprick were assessed and were symmetrical.    Coordination - The patient had normal movements in the hands and feet with no ataxia or dysmetria.  Tremor was absent.  Gait and Station - deferred.  ASSESSMENT/PLAN Teresa Ford is a 77 y.o. female with history of arthritis, anxiety, COPD, depression, HLD, HTN, prediabetes and tobacco abuse who presents to the ED from home via EMS as a Code Stroke after acute onset of right sided weakness and aphasia. LKN was 8:00 PM when she was watching TV with friends; they left the room and when they came back she was on the floor and unable to  speak.   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 ASPECTS 10.    CTA head & neck Acute occlusion of the left MCA, mid M1 segment  Cerebral angio Occluded left M1 segment s/p TICI3 Post IR CT no hemorrhage  MRI/MRA Patchy small volume acute left MCA distribution infarcts. No significant mass effect Probable scattered small volume acute subarachnoid hemorrhage about the left sylvian fissure Interval revascularization of previously seen left M1 occlusion. Left M1 segment now widely patent.  5/15 repeat CT Small focus of hemorrhage in the anterior left temporal lobe similar to the 02/05/2023 MRI. Additional trace SAH in the posterior  left frontal sulci is also more apparent on the prior MRI. [place] groin left groin hematoma, no pseudoaneurysm.  2D Echo EF 60-65% [place] BLE no DVT LDL 66 HgbA1c 5.0 VTE prophylaxis - SCD's aspirin 81 mg daily prior to admission, now on aspirin 81 mg. Plan to start eliquis in the AM if neuro exam remains stable, given stable bleed on CT 5/15. Therapy recommendations: No follow-up is needed Disposition:  pending   Afib w/RVR, now controlled with medication Frequency and duration of RVR increased overnight. Cardiology consult Continue Cardizem gtt.  Will transition to p.o. in the AM if rate continues to control be controlled on medication. New diagnosis, Captured on tele and EKG Now on ASA Plan to start eliquis in the AM given the small hemorrhagic conversion on CT  Hypertension Home meds:  HCTZ 25 mg, losartan 50mg , on hold. Will not be restarted if patient is placed on PO Cardizem.  Stable BP goal <180/105 Long-term BP goal normotensive  Hyperlipidemia Home meds:  Crestor 40mg , resumed in hospital LDL 66, goal < 70 On crestor 40 Continue statin at discharge  Tobacco Use  Encourage cessation  Nicotine patch in  place Pt is wiling to quit  Other Stroke Risk Factors Advanced Age >/= 25   Other Active Problems COPD Anxiety/depression   Hospital day # 3   Pt seen by Neuro NP/APP and later by MD. Note/plan to be edited by MD as needed.    Lynnae January, DNP, AGACNP-BC [facility] Please use AMION for contact information & EPIC for messaging.  ATTENDING NOTE: I reviewed above note and agree with the assessment and plan. Pt was seen and examined.   Daughter at bedside.  Patient overnight had A-fib RVR, continue in the morning.  Cardiology consulted.  Put on Cardizem IV, currently rate controlled, plan to transition to p.o. in a.m.  Currently on aspirin 81.  If neuro stable in a.m., will transition to Eliquis.  Plan for discharge tomorrow if medically ready.  For detailed assessment and plan, please refer to above/below as I have made changes wherever appropriate.   Marvel Plan, MD PhD Stroke Neurology 02/07/2023 10:40 PM  I discussed with cardiology. I spent extensive face-to-face time with the patient and her daughter given new developed A-fib RVR, more than 50% of which was spent in counseling and coordination of care, reviewing test results, images and medication, and discussing the diagnosis, treatment plan and potential prognosis. This patient's care requiresreview of multiple databases, neurological assessment, discussion with family, other specialists and medical decision making of high complexity.       To contact Stroke Continuity provider, please refer to [email or web address]. After hours, contact General Neurology

## 2023-02-07 NOTE — Progress Notes (Signed)
Speech Language Pathology Treatment: Cognitive-Linquistic  Patient Details Name: Teresa Ford MRN: [id number] DOB: [birth year] Today's Date: 02/07/2023 Time: 4098-1191 SLP Time Calculation (min) (ACUTE ONLY): 22 min  Assessment / Plan / Recommendation Clinical Impression  Pt was seen for treatment to complete assessment of communication and cognition. [name] and best friend were present and reported that the pt demonstrated occasional word retrieval difficulty yesterday, but otherwise appears to be at baseline. Pt accurately followed up to 3-step commands, and demonstrated adequate auditory comprehension of paragraph-level material. No evidence of word retrieval difficulty was noted during conversation today. Pt's articulatory imprecision was discussed with family who agreed that this has been her baseline since her hearing impairment. The [facility] Mental Status Examination was completed to evaluate the pt's cognitive-linguistic skills. She achieved a score of 27/30 which is within the normal limits of 27 or more out of 30. Points were lost during delayed recall and for the length of the hands during the clock drawing task. Some difficulty with executive function was noted during informal cognitive-linguistic tasks. Pt, her family, and friend agree that the pt's performance today appears to be representative of the pt's baseline.  Further acute skilled SLP services are not clinically indicated at this time. However, pt has agreed to let her PCP know if she notices a changes in cognition/communication when she returns home.    HPI HPI: Pt is a 77 y.o. female who present 5/13 with c/o R side weakness and aphasia. 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 Pt received TNK and underwent thrombectomy 5/13. MRI brain 5/14: Patchy small volume acute left MCA distribution infarcts as above. PMH:  OA, anxiety, COPD, depression, HLD, HTN, prediabetes, tobacco abuse      SLP Plan  All  goals met;Discharge SLP treatment due to (comment)      Recommendations for follow up therapy are one component of a multi-disciplinary discharge planning process, led by the attending physician.  Recommendations may be updated based on patient status, additional functional criteria and insurance authorization.    Recommendations                           Aphasia (R47.01)     All goals met;Discharge SLP treatment due to (comment)    Shaily Librizzi I. Vear Clock, MS, CCC-SLP Neuro Diagnostic Specialist  Acute Rehabilitation Services Office number: [phone]  Scheryl Marten  02/07/2023, 9:32 AM

## 2023-02-07 NOTE — Progress Notes (Signed)
Physical Therapy Treatment Patient Details Name: Teresa Ford MRN: [id number] DOB: [birth year] Today's Date: 02/07/2023   History of Present Illness Pt is a 76 y.o. female who present to the ED 5/13 with c/o R side weakness and aphasia. 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 She received TNK and underwent thrombectomy 5/13. PMH:  OA, anxiety, COPD, depression, HLD, HTN, prediabetes, tobacco abuse    PT Comments    Pt pleasant and reports living alone with plans to move to [place] with her daughter. Daughter currently staying with pt for approximately 3 months until pt able to move to [facility] Pt reports several recent falls and does use cane in the community at times. Pt and family educated for BE FAST, balance and gait deficits and safety with mobility. Pt able to complete stairs without assist and moving well for return home without further therapy needs post acute. Encouraged OOB and mobility daily.   HR 95-122 with gait   Recommendations for follow up therapy are one component of a multi-disciplinary discharge planning process, led by the attending physician.  Recommendations may be updated based on patient status, additional functional criteria and insurance authorization.  Follow Up Recommendations       Assistance Recommended at Discharge PRN  Patient can return home with the following Assistance with cooking/housework   Equipment Recommendations  None recommended by PT    Recommendations for Other Services       Precautions / Restrictions Precautions Precautions: Fall;Other (comment) Precaution Comments: watch HR     Mobility  Bed Mobility               General bed mobility comments: in chair on arrival and end of session    Transfers Overall transfer level: Modified independent   Transfers: Sit to/from Stand Sit to Stand: Modified independent (Device/Increase time)           General transfer comment: assist for lines only     Ambulation/Gait Ambulation/Gait assistance: Supervision Gait Distance (Feet): 400 Feet Assistive device: None Gait Pattern/deviations: Step-through pattern, Decreased stride length   Gait velocity interpretation: 1.31 - 2.62 ft/sec, indicative of limited community ambulator   General Gait Details: pt with slight right foot shuffling with gait at times. HR 122 max , decreased speed and stride, able to perform decrease in speed but not increase and maintained slow steady gait with head turns   Stairs Stairs: Yes Stairs assistance: Modified independent (Device/Increase time) Stair Management: Alternating pattern, Forwards Number of Stairs: 6 General stair comments: 6 stairs with left rail with slight imbalance on last step down but able to maintain standing and safety with use of rail   Wheelchair Mobility    Modified Rankin (Stroke Patients Only) Modified Rankin (Stroke Patients Only) Pre-Morbid Rankin Score: No symptoms Modified Rankin: Slight disability     Balance Overall balance assessment: Mild deficits observed, not formally tested                             High Level Balance Comments: pt able to perform head turns during gait without significant change to gait sequence or speed, unable to demonstrate increased speed, able to step over object and pick object off the floor without assist. Pt reports several recent falls. Education for cane in the community to maximize safety and cell phone nearby if alone            Cognition Arousal/Alertness: Awake/alert Behavior During Therapy: [facility]  for tasks assessed/performed Overall Cognitive Status: Impaired/Different from baseline Area of Impairment: Memory                     Memory: Decreased short-term memory         General Comments: pt unable to recall the meaning of BE FAST despite education x 2 during session        Exercises      General Comments        Pertinent Vitals/Pain  Pain Assessment Pain Assessment: No/denies pain    Home Living                          Prior Function            PT Goals (current goals can now be found in the care plan section) Progress towards PT goals: Progressing toward goals    Frequency    Min 3X/week      PT Plan Current plan remains appropriate    Co-evaluation              AM-PAC PT "6 Clicks" Mobility   Outcome Measure  Help needed turning from your back to your side while in a flat bed without using bedrails?: None Help needed moving from lying on your back to sitting on the side of a flat bed without using bedrails?: None Help needed moving to and from a bed to a chair (including a wheelchair)?: None Help needed standing up from a chair using your arms (e.g., wheelchair or bedside chair)?: None Help needed to walk in hospital room?: A Little Help needed climbing 3-5 steps with a railing? : None 6 Click Score: 23    End of Session     Patient left: in chair;with call bell/phone within reach;with family/visitor present Nurse Communication: Mobility status PT Visit Diagnosis: Other abnormalities of gait and mobility (R26.89)     Time: 1209-1227 PT Time Calculation (min) (ACUTE ONLY): 18 min  Charges:  $Gait Training: 8-22 mins                     Teresa Ford, PT Acute Rehabilitation Services Office: [phone]    Teresa Ford Teresa Ford 02/07/2023, 1:22 PM

## 2023-02-07 NOTE — Plan of Care (Signed)
  Problem: Education: Goal: Knowledge of disease or condition will improve Outcome: Progressing Goal: Knowledge of secondary prevention will improve (MUST DOCUMENT ALL) Outcome: Progressing Goal: Knowledge of patient specific risk factors will improve (Mark N/A or DELETE if not current risk factor) Outcome: Progressing   Problem: Ischemic Stroke/TIA Tissue Perfusion: Goal: Complications of ischemic stroke/TIA will be minimized Outcome: Progressing   Problem: Coping: Goal: Will verbalize positive feelings about self Outcome: Progressing Goal: Will identify appropriate support needs Outcome: Progressing   Problem: Health Behavior/Discharge Planning: Goal: Ability to manage health-related needs will improve Outcome: Progressing Goal: Goals will be collaboratively established with patient/family Outcome: Progressing   Problem: Self-Care: Goal: Ability to participate in self-care as condition permits will improve Outcome: Progressing Goal: Verbalization of feelings and concerns over difficulty with self-care will improve Outcome: Progressing Goal: Ability to communicate needs accurately will improve Outcome: Progressing   Problem: Nutrition: Goal: Risk of aspiration will decrease Outcome: Progressing Goal: Dietary intake will improve Outcome: Progressing   Problem: Education: Goal: Understanding of CV disease, CV risk reduction, and recovery process will improve Outcome: Progressing Goal: Individualized Educational Video(s) Outcome: Progressing   Problem: Activity: Goal: Ability to return to baseline activity level will improve Outcome: Progressing   Problem: Cardiovascular: Goal: Ability to achieve and maintain adequate cardiovascular perfusion will improve Outcome: Progressing Goal: Vascular access site(s) Level 0-1 will be maintained Outcome: Progressing   Problem: Health Behavior/Discharge Planning: Goal: Ability to safely manage health-related needs after  discharge will improve Outcome: Progressing   Problem: Education: Goal: Knowledge of General Education information will improve Description: Including pain rating scale, medication(s)/side effects and non-pharmacologic comfort measures Outcome: Progressing   Problem: Health Behavior/Discharge Planning: Goal: Ability to manage health-related needs will improve Outcome: Progressing   Problem: Clinical Measurements: Goal: Ability to maintain clinical measurements within normal limits will improve Outcome: Progressing Goal: Will remain free from infection Outcome: Progressing Goal: Diagnostic test results will improve Outcome: Progressing Goal: Respiratory complications will improve Outcome: Progressing Goal: Cardiovascular complication will be avoided Outcome: Progressing   Problem: Activity: Goal: Risk for activity intolerance will decrease Outcome: Progressing   Problem: Nutrition: Goal: Adequate nutrition will be maintained Outcome: Progressing   Problem: Coping: Goal: Level of anxiety will decrease Outcome: Progressing   Problem: Elimination: Goal: Will not experience complications related to bowel motility Outcome: Progressing Goal: Will not experience complications related to urinary retention Outcome: Progressing   Problem: Pain Managment: Goal: General experience of comfort will improve Outcome: Progressing   Problem: Safety: Goal: Ability to remain free from injury will improve Outcome: Progressing   Problem: Skin Integrity: Goal: Risk for impaired skin integrity will decrease Outcome: Progressing   

## 2023-02-07 NOTE — Consult Note (Signed)
Cardiology Consultation   Patient ID: Teresa Ford MRN: [id number]; DOB: 17-Jul-1946  Admit date: 02/04/2023 Date of Consult: 02/07/2023  PCP:  Darrow Bussing, MD    [facility]Care Providers Cardiologist:  None      Patient Profile:   Teresa Ford is a 77 y.o. female with a hx of arthritis, anxiety, COPD, depression, HLD, HTN, prediabetes, tobacco use who is being seen 02/07/2023 for the evaluation of afib with RVR at the request of Dr. Roda Shutters  History of Present Illness:   [name] is a 77 year old female with above medical history. Per chart review, she was seen once by Dr. Elease Hashimoto in 2017 for evaluation of coronary artery calcifications. She had a CT scan of her chest to look for lung nodules given ongoing tobacco use, and was found to have extensive calcification of all three arteries. She underwent myoview study on 10/25/15 that was low risk study. EF as 77%, and there was a small area of apical attenuation that may be an old MI. Patient was lost to follow up.   She presented to the ED on 5/13 as a code stroke. She had acute on sent of right sided weakness and aphasia while at home that evening. CT head showed no acute intracranial hemorrhage. Patient was given TNK on 5/13. 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 Underwent lt MCA thrombectomy TICI 3 on 5/13.Patient was admitted to the neurology service. Underwent echocardiogram on 5/14 that showed EF 60-65%, no regional wall motion abnormalities, normal RV systolic function, mild mitral valve regurgitation. Patient had an EKG on 5/15 that showed new onset atrial fibrillation.   On interview, patient denies having any past cardiac history. She denies having any symptoms of afib or elevated HR. Denies chest pain, palpitations, dizziness, syncope, near syncope, worsening shortness of breath. She has some degree of chronic shortness of breath due to COPD, which is usually improved with use of  inhalers. She has had 2 falls in the past 2 weeks. Neither of these falls were associated with dizziness or syncope. She reports having some balance issues, and has only recently started to use a cane. She does not always use her cane, but her family would like her to use the cane consistently due to her poor balance.   Today, her HR became elevated to the 130s-150s. She was asymptomatic, but knew her HR was elevated because of the monitor. She was started on IV diltiazem by neurology    Past Medical History:  Diagnosis Date   Anxiety    Arthritis    COPD (chronic obstructive pulmonary disease) (HCC)    Depression    Hyperlipidemia    Hypertension    Mixed incontinence 2010   Osteopenia    Overweight    Prediabetes    Renal cyst    left   Tobacco abuse     Past Surgical History:  Procedure Laterality Date   BREAST EXCISIONAL BIOPSY Right 1972   benign   BREAST SURGERY  1972   benign tumor removed rt breast   BUNIONECTOMY  12/2002   IR CT HEAD LTD  02/04/2023   IR PERCUTANEOUS ART THROMBECTOMY/INFUSION INTRACRANIAL INC DIAG ANGIO  02/04/2023   RADIOLOGY WITH ANESTHESIA N/A 02/04/2023   Procedure: RADIOLOGY WITH ANESTHESIA;  Surgeon: Radiologist, Medication, MD;  Location: [facility] OR;  Service: Radiology;  Laterality: N/A;   VAGINAL HYSTERECTOMY  1976     Home Medications:  Prior to Admission  medications   Medication Sig Start Date End Date Taking? Authorizing Provider  aspirin 81 MG tablet Take 81 mg by mouth daily.   Yes [provider]  bimatoprost (LUMIGAN) 0.01 % SOLN Place 1 drop into both eyes at bedtime.   Yes [provider]  Calcium Carbonate-Vitamin D (CALCIUM 600/VITAMIN D PO) Take 2 tablets by mouth daily.   Yes [provider]  cycloSPORINE, PF, (CEQUA) 0.09 % SOLN Place 1 drop into both eyes 2 (two) times daily.   Yes [provider]  hydrochlorothiazide (HYDRODIURIL) 25 MG tablet Take 25 mg by mouth daily. 01/28/19  Yes [provider]  INCRUSE ELLIPTA 62.5 MCG/INH AEPB Inhale 1 puff into the lungs daily. 03/18/19  Yes [provider]  loratadine (CLARITIN) 10 MG tablet Take 10 mg by mouth daily.   Yes [provider]  losartan (COZAAR) 50 MG tablet Take 50 mg by mouth daily. 02/06/19  Yes [provider]  Multiple Vitamins-Minerals (CENTRUM SILVER PO) Take 1 tablet by mouth daily.   Yes [provider]  Omega-3 Fatty Acids (FISH OIL PO) Take 1 tablet by mouth daily.   Yes [provider]  rosuvastatin (CRESTOR) 40 MG tablet Take 40 mg by mouth daily.   Yes [provider]  VITAMIN D PO Take 1 tablet by mouth daily.   Yes [provider]  vitamin E 180 MG (400 UNITS) capsule Take 400 Units by mouth daily.   Yes [provider]    Inpatient Medications: Scheduled Meds:  aspirin EC  81 mg Oral Daily   calcium-vitamin D  1 tablet Oral Daily   Chlorhexidine Gluconate Cloth  6 each Topical Daily   cholecalciferol  400 Units Oral Daily   nicotine  21 mg Transdermal Daily   pantoprazole  40 mg Oral QHS   rosuvastatin  40 mg Oral Daily   sodium chloride flush  3 mL Intravenous Once   vitamin E  400 Units Oral Daily   Continuous Infusions:  diltiazem (CARDIZEM) infusion 5 mg/hr (02/07/23 1057)   PRN Meds: acetaminophen **OR** acetaminophen (TYLENOL) oral liquid 160 mg/5 mL **OR** acetaminophen, acetaminophen **OR** acetaminophen (TYLENOL) oral liquid 160 mg/5 mL **OR** acetaminophen, ipratropium-albuterol, senna-docusate  Allergies:    Allergies  Allergen Reactions   Lamisil [Terbinafine] Rash    Social History:   Social History   Socioeconomic History   Marital status: Widowed    Spouse name: Not on file   Number of children: 2   Years of education: Not on file   Highest education level: Not on file  Occupational History   Occupation: retired  Tobacco Use   Smoking status: Every Day    Packs/day: 1.00    Years: 40.00     Additional pack years: 0.00    Total pack years: 40.00    Types: Cigarettes   Smokeless tobacco: Never  Substance and Sexual Activity   Alcohol use: Yes    Alcohol/week: 3.0 - 4.0 standard drinks of alcohol    Types: 3 - 4 Glasses of wine per week   Drug use: No   Sexual activity: Not Currently    Partners: Male    Birth control/protection: Surgical    Comment: TVH-1ST INTERCOURSE: 26, <5 partners, hysterectomy  Other Topics Concern   Not on file  Social History Narrative   Not on file   Social Determinants of Health   Financial Resource Strain: Not on file  Food Insecurity: Not on file  Transportation  Needs: Not on file  Physical Activity: Not on file  Stress: Not on file  Social Connections: Not on file  Intimate Partner Violence: Not on file    Family History:    Family History  Problem Relation Age of Onset   Heart disease Mother    Dementia Mother    Osteoporosis Mother    Diabetes Father    Heart attack Father    Cancer Sister        lung cancer     ROS:  Please see the history of present illness.   All other ROS reviewed and negative.     Physical Exam/Data:   Vitals:   02/06/23 1920 02/06/23 2350 02/07/23 0349 02/07/23 0741  BP: 137/81 111/85 117/72 115/86  Pulse: (!) 131 100 92 97  Resp:   20 18  Temp: 98.2 F (36.8 C) 98.3 F (36.8 C) 98.5 F (36.9 C) 98.8 F (37.1 C)  TempSrc: Oral Oral Oral Oral  SpO2: 97% 95% 97% 96%  Weight:       No intake or output data in the 24 hours ending 02/07/23 1138    02/04/2023    8:00 PM 07/26/2021    2:40 PM 04/08/2019   10:47 AM  Last 3 Weights  Weight (lbs) 141 lb 8.6 oz 137 lb 156 lb  Weight (kg) 64.2 kg 62.143 kg 70.761 kg     Body mass index is 25.47 kg/m.  General:  Well nourished, well developed, in no acute distress. Sitting upright in the chair  HEENT: normal Neck: no JVD Vascular: Radial pulses 2+ bilaterally Cardiac:  normal S1, S2; irregular rate and rhythm. No murmurs  Lungs:  clear to  auscultation bilaterally, no wheezing, rhonchi or rales. Normal work of breathing on room air  Abd: soft, nontender, no hepatomegaly  Ext: no edema in BLE Musculoskeletal:  No deformities, BUE and BLE strength normal and equal Skin: warm and dry. She has multiple bruises on bilateral upper extremities  Neuro:  CNs 2-12 intact, no focal abnormalities noted Psych:  Normal affect   EKG:  The EKG was personally reviewed and demonstrates:  Atrial fibrillation, HR 102 BPM Telemetry:  Telemetry was personally reviewed and demonstrates:  Atrial fibrillation, HR in the 130s-150s this AM. After starting IV diltiazem, HR improved to the 80s. Brief episode of NSR with HR in the 60s  Relevant CV Studies:  Echocardiogram 02/05/23 1. Left ventricular ejection fraction, by estimation, is 60 to 65%. The  left ventricle has normal function. The left ventricle has no regional  wall motion abnormalities. Left ventricular diastolic parameters are  indeterminate.   2. Right ventricular systolic function is normal. The right ventricular  size is normal.   3. The mitral valve is degenerative. Mild mitral valve regurgitation. No  evidence of mitral stenosis.   4. The aortic valve is normal in structure. Aortic valve regurgitation is  not visualized. No aortic stenosis is present.   5. The inferior vena cava is normal in size with greater than 50%  respiratory variability, suggesting right atrial pressure of 3 mmHg.    Laboratory Data:  High Sensitivity Troponin:  No results for input(s): "TROPONINIHS" in the last 720 hours.   Chemistry Recent Labs  Lab 02/04/23 2054 02/04/23 2103 02/05/23 0618  NA 137 140 136  K 2.8* 2.9* 4.0  CL 105 104 105  CO2 21*  --  23  GLUCOSE 117* 112* 229*  BUN 21 22 23   CREATININE 0.87 1.00 0.91  CALCIUM 7.8*  --  8.6*  GFRNONAA >60  --  >60  ANIONGAP 11  --  8    Recent Labs  Lab 02/04/23 2054  PROT 4.8*  ALBUMIN 2.8*  AST 24  ALT 14  ALKPHOS 43  BILITOT 0.4    Lipids  Recent Labs  Lab 02/05/23 0618  CHOL 126  TRIG 28  HDL 54  LDLCALC 66  CHOLHDL 2.3    Hematology Recent Labs  Lab 02/04/23 2054 02/04/23 2103 02/05/23 0618  WBC 5.8  --  8.4  RBC 3.46*  --  3.42*  HGB 12.1 11.6* 11.9*  HCT 35.0* 34.0* 34.3*  MCV 101.2*  --  100.3*  MCH 35.0*  --  34.8*  MCHC 34.6  --  34.7  RDW 12.0  --  12.0  PLT 217  --  206   Thyroid No results for input(s): "TSH", "FREET4" in the last 168 hours.  BNPNo results for input(s): "BNP", "PROBNP" in the last 168 hours.  DDimer No results for input(s): "DDIMER" in the last 168 hours.   Radiology/Studies:  CT HEAD WO CONTRAST ( )  Result Date: 02/06/2023 CLINICAL DATA:  Stroke, follow-up after thrombectomy EXAM: CT HEAD WITHOUT CONTRAST TECHNIQUE: Contiguous axial images were obtained from the base of the skull through the vertex without intravenous contrast. RADIATION DOSE REDUCTION: This exam was performed according to the departmental dose-optimization program which includes automated exposure control, adjustment of the mA and/or kV according to patient size and/or use of iterative reconstruction technique. COMPARISON:  02/04/2023 CT head and 02/04/2023 IR CT head, correlation is also made with MRI head 02/05/2023 FINDINGS: Brain: Small focus of hyperdensity in the anterior left temporal lobe (series 3, image 13), consistent with the small focus of hemorrhage noted on the prior MRI. Additional trace hyperdensity in the in the posterior left frontal sulci, likely trace subarachnoid hemorrhage (series 3, image 17), which is more apparent on the prior MRI the small focus of intraventricular hemorrhage noted on the MRI is not apparent on CT. Gray-white differentiation is preserved. A small area of hypodensity in the posterior left lentiform nucleus (series 3, image 17), may correlate with the left basal ganglia infarct seen on MRI. The other acute infarcts are not apparent. No mass effect or midline shift.  No hydrocephalus. Vascular: No hyperdense vessel. Skull: Negative for fracture or focal lesion. Sinuses/Orbits: No acute finding. Other: The mastoid air cells are well aerated. IMPRESSION: 1. Small focus of hemorrhage in the anterior left temporal lobe, which appears similar to the 02/05/2023 MRI, when accounting for differences in technique. Additional trace subarachnoid hemorrhage in the posterior left frontal sulci is also more apparent on the prior MRI. 2. A small area of hypodensity in the posterior left lentiform nucleus, may correlate with the left basal ganglia infarct seen on the prior MRI. The other acute infarcts are not apparent on CT. No mass effect or midline shift. Electronically Signed   By: Wiliam Ke M.D.   On: 02/06/2023 15:56   IR PERCUTANEOUS ART THROMBECTOMY/INFUSION INTRACRANIAL INC DIAG ANGIO  Result Date: 02/06/2023 INDICATION: New onset aphasia, left-sided gaze deviation and right-sided hemiplegia. Occluded left middle cerebral artery on CT angiogram of the head and neck. EXAM: 1. EMERGENT LARGE VESSEL OCCLUSION THROMBOLYSIS (anterior CIRCULATION) COMPARISON:  CT angiogram of the head and neck of Feb 04, 2023. MEDICATIONS: Ancef 2 g IV antibiotic was administered within 1 hour of the procedure. ANESTHESIA/SEDATION: General anesthesia. CONTRAST:  Omnipaque 300 approximately 70 mL. FLUOROSCOPY TIME:  Fluoroscopy Time: 11 minutes 18 seconds (776 mGy). COMPLICATIONS: None immediate. TECHNIQUE: Following a full explanation of the procedure along with the potential associated complications, an informed witnessed consent was obtained. The risks of intracranial hemorrhage of 10%, worsening neurological deficit, ventilator dependency, death and inability to revascularize were all reviewed in detail with the patient's daughter. The patient was then put under general anesthesia by the Department of Anesthesiology at [facility]. The right groin was prepped and draped in the usual  sterile fashion. Thereafter using modified Seldinger technique, transfemoral access into the right common femoral artery was obtained without difficulty. Over an 0.035 inch guidewire an 8 French 25 cm Pinnacle sheath was inserted. Through this, and also over an 0.035 inch guidewire a combination of a 125 cm 5.5 French Simmons 2 support catheter inside of an 087 95 cm balloon guide catheter was advanced to the aortic arch region and positioned in the left common carotid artery and then the left internal carotid artery. FINDINGS: The left common carotid arteriogram demonstrates the left external carotid artery and its major branches to be widely patent. The left internal carotid artery at the bulb demonstrates a soft shallow plaque along the posterior wall without resulting stenosis, or of ulcerations. The vessel is seen to opacify to the cranial skull base. The petrous, the cavernous and the supraclinoid left ICA demonstrate wide patency. Patency is seen of the petrous, cavernous and supraclinoid left ICA. A left posterior communicating artery is seen opacifying the left posterior cerebral artery distribution with transient retrograde flow into the distal basilar artery. The left middle cerebral artery demonstrates complete occlusion in its mid M1 segment. The left anterior cerebral artery opacifies into the capillary and venous phases. PROCEDURE: Through the balloon guide catheter in the distal cervical left ICA, a combination of an 071 2 cm Zoom aspiration catheter inside of which was a 160 cm microcatheter was advanced over an 018 inch micro guidewire with moderate J configuration to the supraclinoid left ICA. The micro guidewire was then advanced using a torque device into the distal M2 M3 segment of the left middle cerebral artery inferior division followed by the microcatheter. The guidewire was removed. Good aspiration obtained from the hub of the microcatheter which was then connected to continuous  heparinized saline infusion. A 3 mm x 40 mm Solitaire X retrieval device was then advanced to the distal end of the microcatheter and deployed in the usual manner without difficulty. The proximal portion of the device was seen just proximal to the thrombus. At this time, the Zoom aspiration catheter was advanced and engaged into the proximal portion of the clot. Constant aspiration was then applied at the hub of the Zoom aspiration catheter for 2 minutes with a Penumbra aspiration device, and a 20 mL syringe at the hub of the balloon guide catheter. The combination of the retrieval device, the Zoom aspiration catheter, and the microcatheter was removed. Following reversal of flow arrest, a control arteriogram performed through the balloon guide catheter in the left internal carotid artery demonstrated complete revascularization of the left middle cerebral artery distribution with patency maintained of the left posterior communicating artery in the left anterior cerebral artery. A TICI 3 revascularization of the MCA was obtained. The balloon guide catheter was then retrieved proximally in the left common carotid artery. A control arteriogram performed through this demonstrated the left internal carotid artery extra cranially and intracranially to be widely patent. The left MCA and the left anterior cerebral artery distributions continued  to show patency without evidence of intraluminal filling defects, or of occlusions. The balloon guide was removed. A 7 French Angio-Seal closure device was then deployed for hemostasis in addition to manual compression with quick clot for approximately 20 minutes. Distal pulses remained Dopplerable in both feet unchanged from prior to the procedure. A flat panel CT of the brain demonstrated no evidence of intracranial hemorrhage. The patient was extubated. Upon recovery, the patient was able to follow simple commands appropriately. She was able to move her right arm and leg against  gravity. Tongue was midline, pupils were equal bilaterally unchanged. She was then transferred to the neuro ICU for post revascularization management. IMPRESSION: Status post complete revascularization of occluded left MCA distal M1 segment with 1 pass with a 3 mm x 40 mm Solitaire X retrieval device, and contact aspiration achieving a TICI 3 revascularization. PLAN: Follow-up as per referring MD. Electronically Signed   By: Julieanne Cotton M.D.   On: 02/06/2023 09:42   IR CT Head Ltd  Result Date: 02/06/2023 INDICATION: New onset aphasia, left-sided gaze deviation and right-sided hemiplegia. Occluded left middle cerebral artery on CT angiogram of the head and neck. EXAM: 1. EMERGENT LARGE VESSEL OCCLUSION THROMBOLYSIS (anterior CIRCULATION) COMPARISON:  CT angiogram of the head and neck of Feb 04, 2023. MEDICATIONS: Ancef 2 g IV antibiotic was administered within 1 hour of the procedure. ANESTHESIA/SEDATION: General anesthesia. CONTRAST:  Omnipaque 300 approximately 70 mL. FLUOROSCOPY TIME:  Fluoroscopy Time: 11 minutes 18 seconds (776 mGy). COMPLICATIONS: None immediate. TECHNIQUE: Following a full explanation of the procedure along with the potential associated complications, an informed witnessed consent was obtained. The risks of intracranial hemorrhage of 10%, worsening neurological deficit, ventilator dependency, death and inability to revascularize were all reviewed in detail with the patient's daughter. The patient was then put under general anesthesia by the Department of Anesthesiology at [facility]. The right groin was prepped and draped in the usual sterile fashion. Thereafter using modified Seldinger technique, transfemoral access into the right common femoral artery was obtained without difficulty. Over an 0.035 inch guidewire an 8 French 25 cm Pinnacle sheath was inserted. Through this, and also over an 0.035 inch guidewire a combination of a 125 cm 5.5 French Simmons 2 support  catheter inside of an 087 95 cm balloon guide catheter was advanced to the aortic arch region and positioned in the left common carotid artery and then the left internal carotid artery. FINDINGS: The left common carotid arteriogram demonstrates the left external carotid artery and its major branches to be widely patent. The left internal carotid artery at the bulb demonstrates a soft shallow plaque along the posterior wall without resulting stenosis, or of ulcerations. The vessel is seen to opacify to the cranial skull base. The petrous, the cavernous and the supraclinoid left ICA demonstrate wide patency. Patency is seen of the petrous, cavernous and supraclinoid left ICA. A left posterior communicating artery is seen opacifying the left posterior cerebral artery distribution with transient retrograde flow into the distal basilar artery. The left middle cerebral artery demonstrates complete occlusion in its mid M1 segment. The left anterior cerebral artery opacifies into the capillary and venous phases. PROCEDURE: Through the balloon guide catheter in the distal cervical left ICA, a combination of an 071 2 cm Zoom aspiration catheter inside of which was a 160 cm microcatheter was advanced over an 018 inch micro guidewire with moderate J configuration to the supraclinoid left ICA. The micro guidewire was then advanced using a  torque device into the distal M2 M3 segment of the left middle cerebral artery inferior division followed by the microcatheter. The guidewire was removed. Good aspiration obtained from the hub of the microcatheter which was then connected to continuous heparinized saline infusion. A 3 mm x 40 mm Solitaire X retrieval device was then advanced to the distal end of the microcatheter and deployed in the usual manner without difficulty. The proximal portion of the device was seen just proximal to the thrombus. At this time, the Zoom aspiration catheter was advanced and engaged into the proximal  portion of the clot. Constant aspiration was then applied at the hub of the Zoom aspiration catheter for 2 minutes with a Penumbra aspiration device, and a 20 mL syringe at the hub of the balloon guide catheter. The combination of the retrieval device, the Zoom aspiration catheter, and the microcatheter was removed. Following reversal of flow arrest, a control arteriogram performed through the balloon guide catheter in the left internal carotid artery demonstrated complete revascularization of the left middle cerebral artery distribution with patency maintained of the left posterior communicating artery in the left anterior cerebral artery. A TICI 3 revascularization of the MCA was obtained. The balloon guide catheter was then retrieved proximally in the left common carotid artery. A control arteriogram performed through this demonstrated the left internal carotid artery extra cranially and intracranially to be widely patent. The left MCA and the left anterior cerebral artery distributions continued to show patency without evidence of intraluminal filling defects, or of occlusions. The balloon guide was removed. A 7 French Angio-Seal closure device was then deployed for hemostasis in addition to manual compression with quick clot for approximately 20 minutes. Distal pulses remained Dopplerable in both feet unchanged from prior to the procedure. A flat panel CT of the brain demonstrated no evidence of intracranial hemorrhage. The patient was extubated. Upon recovery, the patient was able to follow simple commands appropriately. She was able to move her right arm and leg against gravity. Tongue was midline, pupils were equal bilaterally unchanged. She was then transferred to the neuro ICU for post revascularization management. IMPRESSION: Status post complete revascularization of occluded left MCA distal M1 segment with 1 pass with a 3 mm x 40 mm Solitaire X retrieval device, and contact aspiration achieving a TICI 3  revascularization. PLAN: Follow-up as per referring MD. Electronically Signed   By: Julieanne Cotton M.D.   On: 02/06/2023 09:42   MR BRAIN WO CONTRAST  Result Date: 02/05/2023 CLINICAL DATA:  Follow-up examination for stroke. EXAM: MRI HEAD WITHOUT CONTRAST MRA HEAD WITHOUT CONTRAST TECHNIQUE: Multiplanar, multi-echo pulse sequences of the brain and surrounding structures were acquired without intravenous contrast. Angiographic images of the Circle of Willis were acquired using MRA technique without intravenous contrast. COMPARISON:  Prior studies from 02/04/2023. FINDINGS: MRI HEAD FINDINGS Brain: Cerebral volume within normal limits for age. Patchy T2/FLAIR hyperintensity involving the periventricular and deep white matter both cerebral hemispheres, most likely related chronic microvascular ischemic disease, mild for age. Few scattered subtle foci of diffusion signal abnormality are seen involving the left MCA distribution. Involvement of the left basal ganglia (series 2, images 33, 29). Minimal patchy cortical involvement at the overlying left frontal and temporal lobes (series 2, images 35, 27). Scattered superimposed FLAIR hyperintensity with susceptibility artifact present in this region (series 7, images 20). Additional signal abnormality noted at the level of the left MCA bifurcation (series 8, image 42). Findings highly suspicious for superimposed small volume acute subarachnoid hemorrhage.  Probable trace read distributed blood at the right occipital horn noted (series 8, image 50). No other evidence for acute or subacute ischemia. No other acute or chronic intracranial blood products. No mass lesion or midline shift. No hydrocephalus or extra-axial fluid collection. Pituitary gland and suprasellar region within normal limits. Vascular: Major intracranial vascular flow voids are maintained. Skull and upper cervical spine: Craniocervical junction within normal limits. Bone marrow signal intensity  normal. No scalp soft tissue abnormality. Sinuses/Orbits: Globes and orbital soft tissues within normal limits. Paranasal sinuses are largely clear. No mastoid effusion. Other: None. MRA HEAD FINDINGS Anterior circulation: Mild atheromatous irregularity about the carotid siphons without hemodynamically significant stenosis. A1 segments patent bilaterally. Normal anterior communicating complex. Anterior cerebral arteries patent without stenosis. Interval revascularization of previously seen left M1 occlusion. Left M1 segment now widely patent. Right M1 remains patent. No proximal MCA branch occlusion or high-grade stenosis. Distal MCA branches perfused and fairly symmetric. Posterior circulation: Both vertebral arteries patent without stenosis. Left vertebral artery slightly dominant. Both PICA patent. Basilar patent without stenosis. Superior cerebral arteries patent bilaterally. Both PCAs primarily supplied via the basilar well perfused or distal aspects. Prominent left posterior communicating artery noted. Anatomic variants: None significant.  No aneurysm. IMPRESSION: MRI HEAD IMPRESSION: 1. 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 No significant mass effect. 2. Probable scattered small volume acute subarachnoid hemorrhage about the left sylvian fissure. 3. Underlying mild chronic microvascular ischemic disease. MRA HEAD IMPRESSION: 1. Interval revascularization of previously seen left M1 occlusion. Left M1 segment now widely patent. No large vessel occlusion as seen. 2. Mild intracranial atherosclerotic disease, primarily involving the carotid siphons. No hemodynamically significant or correctable stenosis. These results were communicated to Dr. Otelia Limes at 9:33 pm on 02/05/2023 by text page via the [facility] messaging system. Electronically Signed   By: Rise Mu M.D.   On: 02/05/2023 21:35   MR ANGIO HEAD WO CONTRAST  Result Date: 02/05/2023 CLINICAL DATA:  Follow-up  examination for stroke. EXAM: MRI HEAD WITHOUT CONTRAST MRA HEAD WITHOUT CONTRAST TECHNIQUE: Multiplanar, multi-echo pulse sequences of the brain and surrounding structures were acquired without intravenous contrast. Angiographic images of the Circle of Willis were acquired using MRA technique without intravenous contrast. COMPARISON:  Prior studies from 02/04/2023. FINDINGS: MRI HEAD FINDINGS Brain: Cerebral volume within normal limits for age. Patchy T2/FLAIR hyperintensity involving the periventricular and deep white matter both cerebral hemispheres, most likely related chronic microvascular ischemic disease, mild for age. Few scattered subtle foci of diffusion signal abnormality are seen involving the left MCA distribution. Involvement of the left basal ganglia (series 2, images 33, 29). Minimal patchy cortical involvement at the overlying left frontal and temporal lobes (series 2, images 35, 27). Scattered superimposed FLAIR hyperintensity with susceptibility artifact present in this region (series 7, images 20). Additional signal abnormality noted at the level of the left MCA bifurcation (series 8, image 42). Findings highly suspicious for superimposed small volume acute subarachnoid hemorrhage. Probable trace read distributed blood at the right occipital horn noted (series 8, image 50). No other evidence for acute or subacute ischemia. No other acute or chronic intracranial blood products. No mass lesion or midline shift. No hydrocephalus or extra-axial fluid collection. Pituitary gland and suprasellar region within normal limits. Vascular: Major intracranial vascular flow voids are maintained. Skull and upper cervical spine: Craniocervical junction within normal limits. Bone marrow signal intensity normal. No scalp soft tissue abnormality. Sinuses/Orbits: Globes and orbital soft tissues within normal limits. Paranasal sinuses are  largely clear. No mastoid effusion. Other: None. MRA HEAD FINDINGS Anterior  circulation: Mild atheromatous irregularity about the carotid siphons without hemodynamically significant stenosis. A1 segments patent bilaterally. Normal anterior communicating complex. Anterior cerebral arteries patent without stenosis. Interval revascularization of previously seen left M1 occlusion. Left M1 segment now widely patent. Right M1 remains patent. No proximal MCA branch occlusion or high-grade stenosis. Distal MCA branches perfused and fairly symmetric. Posterior circulation: Both vertebral arteries patent without stenosis. Left vertebral artery slightly dominant. Both PICA patent. Basilar patent without stenosis. Superior cerebral arteries patent bilaterally. Both PCAs primarily supplied via the basilar well perfused or distal aspects. Prominent left posterior communicating artery noted. Anatomic variants: None significant.  No aneurysm. IMPRESSION: MRI HEAD IMPRESSION: 1. 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 No significant mass effect. 2. Probable scattered small volume acute subarachnoid hemorrhage about the left sylvian fissure. 3. Underlying mild chronic microvascular ischemic disease. MRA HEAD IMPRESSION: 1. Interval revascularization of previously seen left M1 occlusion. Left M1 segment now widely patent. No large vessel occlusion as seen. 2. Mild intracranial atherosclerotic disease, primarily involving the carotid siphons. No hemodynamically significant or correctable stenosis. These results were communicated to Dr. Otelia Limes at 9:33 pm on 02/05/2023 by text page via the [facility] messaging system. Electronically Signed   By: Rise Mu M.D.   On: 02/05/2023 21:35   VAS [place] GROIN PSEUDOANEURYSM  Result Date: 02/05/2023  ARTERIAL PSEUDOANEURYSM  Patient Name:  KORIANNE BRAR  Date of Exam:   02/05/2023 Medical Rec #: [id number]           Accession #:    [id number] Date of Birth: 1945/11/19           Patient Gender: F Patient Age:   30 years Exam Location:   [facility] Procedure:      VAS [place] Bobetta Lime Referring Phys: Lawernce Ion --------------------------------------------------------------------------------  Exam: Right groin Indications: Patient complains of bruising and bleeding. History: S/p catheterization. Comparison Study: No prior studies. Performing Technologist: Jean Rosenthal RDMS, RVT  Examination Guidelines: A complete evaluation includes B-mode imaging, spectral Doppler, color Doppler, and power Doppler as needed of all accessible portions of each vessel. Bilateral testing is considered an integral part of a complete examination. Limited examinations for reoccurring indications may be performed as noted. +------------+----------+--------+------+--------------------+ Right DuplexPSV (cm/s)WaveformPlaque     Comment(s)      +------------+----------+--------+------+--------------------+ CFA            281                  Stenotic mid segment +------------+----------+--------+------+--------------------+ PFA            334                   Stenotic at origin  +------------+----------+--------+------+--------------------+ Prox SFA       174                                       +------------+----------+--------+------+--------------------+  Findings: A mixed echogenic structure measuring approximately 2.4 cm x 1.8 cm is visualized at the at the level of the mid common femoral artery with ultrasound characteristics of a hematoma. Arterial disease noted incidentally.  Diagnosing physician: Waverly Ferrari MD Electronically signed by Waverly Ferrari MD on 02/05/2023 at 6:12:07 PM.    --------------------------------------------------------------------------------    Final    VAS [place] LOWER EXTREMITY VENOUS (DVT)  Result Date: 02/05/2023  Lower Venous DVT Study Patient Name:  SERENDIPITY THACKERAY  Date of Exam:   02/05/2023 Medical Rec #: [id number]           Accession #:    [id number] Date of Birth: 09/08/1946            Patient Gender: F Patient Age:   31 years Exam Location:  [facility] Procedure:      VAS [place] LOWER EXTREMITY VENOUS (DVT) Referring Phys: Scheryl Marten XU --------------------------------------------------------------------------------  Indications: Embolic stroke.  Comparison Study: No prior studies. Performing Technologist: Jean Rosenthal RDMS, RVT  Examination Guidelines: A complete evaluation includes B-mode imaging, spectral Doppler, color Doppler, and power Doppler as needed of all accessible portions of each vessel. Bilateral testing is considered an integral part of a complete examination. Limited examinations for reoccurring indications may be performed as noted. The reflux portion of the exam is performed with the patient in reverse Trendelenburg.  +---------+---------------+---------+-----------+----------+--------------+ RIGHT    CompressibilityPhasicitySpontaneityPropertiesThrombus Aging +---------+---------------+---------+-----------+----------+--------------+ CFV      Full           Yes      Yes                                 +---------+---------------+---------+-----------+----------+--------------+ SFJ      Full                                                        +---------+---------------+---------+-----------+----------+--------------+ FV Prox  Full                                                        +---------+---------------+---------+-----------+----------+--------------+ FV Mid   Full                                                        +---------+---------------+---------+-----------+----------+--------------+ FV DistalFull                                                        +---------+---------------+---------+-----------+----------+--------------+ PFV      Full                                                        +---------+---------------+---------+-----------+----------+--------------+ POP      Full           Yes       Yes                                 +---------+---------------+---------+-----------+----------+--------------+ PTV      Full                                                        +---------+---------------+---------+-----------+----------+--------------+  PERO     Full                                                        +---------+---------------+---------+-----------+----------+--------------+   +---------+---------------+---------+-----------+----------+--------------+ LEFT     CompressibilityPhasicitySpontaneityPropertiesThrombus Aging +---------+---------------+---------+-----------+----------+--------------+ CFV      Full           Yes      Yes                                 +---------+---------------+---------+-----------+----------+--------------+ SFJ      Full                                                        +---------+---------------+---------+-----------+----------+--------------+ FV Prox  Full                                                        +---------+---------------+---------+-----------+----------+--------------+ FV Mid   Full                                                        +---------+---------------+---------+-----------+----------+--------------+ FV DistalFull                                                        +---------+---------------+---------+-----------+----------+--------------+ PFV      Full                                                        +---------+---------------+---------+-----------+----------+--------------+ POP      Full           Yes      Yes                                 +---------+---------------+---------+-----------+----------+--------------+ PTV      Full                                                        +---------+---------------+---------+-----------+----------+--------------+ PERO     Full                                                         +---------+---------------+---------+-----------+----------+--------------+  Summary: RIGHT: - There is no evidence of deep vein thrombosis in the lower extremity.  - No cystic structure found in the popliteal fossa.  LEFT: - There is no evidence of deep vein thrombosis in the lower extremity.  - No cystic structure found in the popliteal fossa.  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 PSV 545 cm/s  *See table(s) above for measurements and observations. Electronically signed by Waverly Ferrari MD on 02/05/2023 at 6:11:57 PM.    Final    ECHOCARDIOGRAM COMPLETE  Result Date: 02/05/2023    ECHOCARDIOGRAM REPORT   Patient Name:   GLENDON LOWDERMILK Date of Exam: 02/05/2023 Medical Rec #:  [id number]          Height:       62.5 in Accession #:    [id number]         Weight:       141.5 lb Date of Birth:  11-18-1945          BSA:          1.660 m Patient Age:    77 years           BP:           122/84 mmHg Patient Gender: F                  HR:           66 bpm. Exam Location:  Inpatient Procedure: 2D Echo, Cardiac Doppler and Color Doppler Indications:    Stroke I63.9  History:        Patient has no prior history of Echocardiogram examinations.                 Stroke; Risk Factors:Dyslipidemia and Current Smoker.  Sonographer:    Lucendia Herrlich Referring Phys: [phone] ERIC LINDZEN IMPRESSIONS  1. Left ventricular ejection fraction, by estimation, is 60 to 65%. The left ventricle has normal function. The left ventricle has no regional wall motion abnormalities. Left ventricular diastolic parameters are indeterminate.  2. Right ventricular systolic function is normal. The right ventricular size is normal.  3. The mitral valve is degenerative. Mild mitral valve regurgitation. No evidence of mitral stenosis.  4. The aortic valve is normal in structure. Aortic valve regurgitation is not visualized. No aortic stenosis is present.  5. The inferior vena cava is normal in size with  greater than 50% respiratory variability, suggesting right atrial pressure of 3 mmHg. FINDINGS  Left Ventricle: Left ventricular ejection fraction, by estimation, is 60 to 65%. The left ventricle has normal function. The left ventricle has no regional wall motion abnormalities. The left ventricular internal cavity size was normal in size. There is  no left ventricular hypertrophy. Left ventricular diastolic parameters are indeterminate. Right Ventricle: The right ventricular size is normal. No increase in right ventricular wall thickness. Right ventricular systolic function is normal. Left Atrium: Left atrial size was normal in size. Right Atrium: Right atrial size was normal in size. Pericardium: There is no evidence of pericardial effusion. Presence of epicardial fat layer. Mitral Valve: The mitral valve is degenerative in appearance. Mild to moderate mitral annular calcification. Mild mitral valve regurgitation. No evidence of mitral valve stenosis. Tricuspid Valve: The tricuspid valve is normal in structure. Tricuspid valve regurgitation is not demonstrated. No evidence of tricuspid stenosis. Aortic Valve: The aortic valve is normal in structure. Aortic valve regurgitation is not visualized. No aortic stenosis is present. Aortic valve peak gradient  measures 11.3 mmHg. Pulmonic Valve: The pulmonic valve was normal in structure. Pulmonic valve regurgitation is not visualized. No evidence of pulmonic stenosis. Aorta: The aortic root is normal in size and structure. Venous: The inferior vena cava is normal in size with greater than 50% respiratory variability, suggesting right atrial pressure of 3 mmHg. IAS/Shunts: No atrial level shunt detected by color flow Doppler.  LEFT VENTRICLE PLAX 2D LVIDd:         4.20 cm   Diastology LVIDs:         2.80 cm   LV e' medial:    9.48 cm/s LV PW:         0.90 cm   LV E/e' medial:  11.9 LV IVS:        0.70 cm   LV e' lateral:   10.60 cm/s LVOT diam:     2.00 cm   LV E/e'  lateral: 10.7 LV SV:         69 LV SV Index:   42 LVOT Area:     3.14 cm  RIGHT VENTRICLE             IVC RV S prime:     13.50 cm/s  IVC diam: 2.00 cm TAPSE (M-mode): 2.4 cm LEFT ATRIUM           Index        RIGHT ATRIUM           Index LA diam:      3.70 cm 2.23 cm/m   RA Area:     15.45 cm LA Vol (A4C): 38.6 ml 23.22 ml/m  RA Volume:   37.90 ml  22.83 ml/m  AORTIC VALVE AV Area (Vmax): 1.93 cm AV Vmax:        168.00 cm/s AV Peak Grad:   11.3 mmHg LVOT Vmax:      103.47 cm/s LVOT Vmean:     67.267 cm/s LVOT VTI:       0.219 m  AORTA Ao Asc diam: 2.67 cm MITRAL VALVE                TRICUSPID VALVE MV Area (PHT): 5.13 cm     TR Peak grad:   30.5 mmHg MV Decel Time: 148 msec     TR Vmax:        276.00 cm/s MV E velocity: 113.00 cm/s MV A velocity: 73.30 cm/s   SHUNTS MV E/A ratio:  1.54         Systemic VTI:  0.22 m                             Systemic Diam: 2.00 cm Kardie Tobb DO Electronically signed by Thomasene Ripple DO Signature Date/Time: 02/05/2023/1:23:43 PM    Final    CT HEAD CODE STROKE WO CONTRAST  Result Date: 02/04/2023 CLINICAL DATA:  Neuro deficit, acute, stroke suspected. Right-sided weakness. EXAM: CT HEAD WITHOUT CONTRAST CT ANGIOGRAPHY OF THE HEAD AND NECK TECHNIQUE: Contiguous axial images were obtained from the base of the skull through the vertex without intravenous contrast. Multidetector CT imaging of the head and neck was performed using the standard protocol during bolus administration of intravenous contrast. Multiplanar CT image reconstructions and MIPs were obtained to evaluate the vascular anatomy. Carotid stenosis measurements (when applicable) are obtained utilizing NASCET criteria, using the distal internal carotid diameter as the denominator. RADIATION DOSE REDUCTION: This exam was performed according to the departmental  dose-optimization program which includes automated exposure control, adjustment of the mA and/or kV according to patient size and/or use of iterative  reconstruction technique. CONTRAST:  OMNIPAQUE IOHEXOL 350 MG/ML SOLN COMPARISON:  None Available. FINDINGS: CT HEAD Brain: No acute intracranial hemorrhage. Gray-white differentiation is preserved. No hydrocephalus or extra-axial collection. No mass effect or midline shift. ASPECT score is 10. Vascular: No hyperdense vessel or unexpected calcification. Skull: No calvarial fracture or suspicious bone lesion. Skull base is unremarkable. Sinuses/Orbits: Unremarkable. CTA NECK Aortic arch: Three-vessel arch configuration. Atherosclerotic calcifications of the aortic arch and arch vessel origins. Arch vessel origins are patent. Right carotid system: Calcified plaque results in less than 50% stenosis of the proximal right cervical ICA. Left carotid system: Calcified plaque results in less than 50% stenosis of the proximal left cervical ICA. Vertebral arteries:Patent from the origin to the confluence with the basilar without stenosis or dissection. Skeleton: Mild cervical spondylosis without high-grade spinal canal stenosis. Other neck: Emphysema in the lung apices.  Otherwise unremarkable. CTA HEAD Anterior circulation: Calcified plaque along the carotid siphons without hemodynamically significant stenosis. Abrupt cut off of the left MCA, mid M1 segment. The right MCA and bilateral ACAs are patent without stenosis or aneurysm. Distal branches are symmetric. Posterior circulation: Normal basilar artery. The SCAs, AICAs and PICAs are patent proximally. The PCAs are patent proximally without stenosis or aneurysm. Distal branches are symmetric. Venous sinuses: Patent. Anatomic variants: None. IMPRESSION: 1. No acute intracranial hemorrhage. ASPECT score is 10. 2. Acute occlusion of the left MCA, mid M1 segment. Distal branches are symmetric. 3. No hemodynamically significant stenosis in the neck. Aortic Atherosclerosis (ICD10-I70.0) and Emphysema (ICD10-J43.9). Code stroke imaging results were communicated on 02/04/2023  at 9:28 pm to provider Dr. Otelia Limes via secure text paging. Electronically Signed   By: Orvan Falconer M.D.   On: 02/04/2023 21:34   CT ANGIO HEAD NECK W WO CM (CODE STROKE)  Result Date: 02/04/2023 CLINICAL DATA:  Neuro deficit, acute, stroke suspected. Right-sided weakness. EXAM: CT HEAD WITHOUT CONTRAST CT ANGIOGRAPHY OF THE HEAD AND NECK TECHNIQUE: Contiguous axial images were obtained from the base of the skull through the vertex without intravenous contrast. Multidetector CT imaging of the head and neck was performed using the standard protocol during bolus administration of intravenous contrast. Multiplanar CT image reconstructions and MIPs were obtained to evaluate the vascular anatomy. Carotid stenosis measurements (when applicable) are obtained utilizing NASCET criteria, using the distal internal carotid diameter as the denominator. RADIATION DOSE REDUCTION: This exam was performed according to the departmental dose-optimization program which includes automated exposure control, adjustment of the mA and/or kV according to patient size and/or use of iterative reconstruction technique. CONTRAST:  OMNIPAQUE IOHEXOL 350 MG/ML SOLN COMPARISON:  None Available. FINDINGS: CT HEAD Brain: No acute intracranial hemorrhage. Gray-white differentiation is preserved. No hydrocephalus or extra-axial collection. No mass effect or midline shift. ASPECT score is 10. Vascular: No hyperdense vessel or unexpected calcification. Skull: No calvarial fracture or suspicious bone lesion. Skull base is unremarkable. Sinuses/Orbits: Unremarkable. CTA NECK Aortic arch: Three-vessel arch configuration. Atherosclerotic calcifications of the aortic arch and arch vessel origins. Arch vessel origins are patent. Right carotid system: Calcified plaque results in less than 50% stenosis of the proximal right cervical ICA. Left carotid system: Calcified plaque results in less than 50% stenosis of the proximal left cervical ICA.  Vertebral arteries:Patent from the origin to the confluence with the basilar without stenosis or dissection. Skeleton: Mild cervical spondylosis without high-grade spinal canal stenosis.  Other neck: Emphysema in the lung apices.  Otherwise unremarkable. CTA HEAD Anterior circulation: Calcified plaque along the carotid siphons without hemodynamically significant stenosis. Abrupt cut off of the left MCA, mid M1 segment. The right MCA and bilateral ACAs are patent without stenosis or aneurysm. Distal branches are symmetric. Posterior circulation: Normal basilar artery. The SCAs, AICAs and PICAs are patent proximally. The PCAs are patent proximally without stenosis or aneurysm. Distal branches are symmetric. Venous sinuses: Patent. Anatomic variants: None. IMPRESSION: 1. No acute intracranial hemorrhage. ASPECT score is 10. 2. Acute occlusion of the left MCA, mid M1 segment. Distal branches are symmetric. 3. No hemodynamically significant stenosis in the neck. Aortic Atherosclerosis (ICD10-I70.0) and Emphysema (ICD10-J43.9). Code stroke imaging results were communicated on 02/04/2023 at 9:28 pm to provider Dr. Otelia Limes via secure text paging. Electronically Signed   By: Orvan Falconer M.D.   On: 02/04/2023 21:34     Assessment and Plan:   New onset Atrial Fibrillation with RVR  - Patient was found to be in new onset atrial fibrillation yesterday. This AM, HR increased to the 130s-150s. She was started on IV diltiazem by neurology with improvement in HR. Briefly converted to NSR, but went back into afib - Continue IV diltiazem for now- hopeful that she may convert (she had a brief episode of NSR while I was in the room with her). With how well her HR is controlled, I suspect that she could be transitioned to PO diltiazem later today  - CHADS-VASc 8 (HTN, prediabetes, stroke, vascular disease, age, gender) - Recommend starting eliquis 5 mg BID to prevent stroke risk - Ordered TSH and mag. K 4.0 - Echocardiogram  with EF 60-65%, no regional wall motion abnormalities, normal RV systolic function   HTN  - Prior to admission, she was on both losartan and HCTZ - Her BP has been low end of normal on diltiazem. Likely she will be discharged on diltiazem alone   Mild MR - Noted on echocardiogram this admission. Continue to follow as an outpatient   Risk Assessment/Risk Scores:    CHA2DS2-VASc Score = 8   This indicates a 10.8% annual risk of stroke. The patient's score is based upon: CHF History: 0 HTN History: 1 Diabetes History: 1 Stroke History: 2 Vascular Disease History: 1 Age Score: 2 Gender Score: 1    For questions or updates, please contact [facility] Please consult [email or web address] for contact info under    Signed, Jonita Albee, PA-C  02/07/2023 11:38 AM

## 2023-02-08 ENCOUNTER — Telehealth (HOSPITAL_BASED_OUTPATIENT_CLINIC_OR_DEPARTMENT_OTHER): Payer: Self-pay | Admitting: Cardiology

## 2023-02-08 ENCOUNTER — Other Ambulatory Visit (HOSPITAL_COMMUNITY): Payer: Self-pay

## 2023-02-08 DIAGNOSIS — J441 Chronic obstructive pulmonary disease with (acute) exacerbation: Secondary | ICD-10-CM | POA: Diagnosis present

## 2023-02-08 DIAGNOSIS — I10 Essential (primary) hypertension: Secondary | ICD-10-CM | POA: Diagnosis present

## 2023-02-08 DIAGNOSIS — F411 Generalized anxiety disorder: Secondary | ICD-10-CM | POA: Diagnosis present

## 2023-02-08 DIAGNOSIS — I4891 Unspecified atrial fibrillation: Secondary | ICD-10-CM | POA: Diagnosis not present

## 2023-02-08 DIAGNOSIS — F32A Depression, unspecified: Secondary | ICD-10-CM | POA: Diagnosis present

## 2023-02-08 MED ORDER — DILTIAZEM HCL ER COATED BEADS 120 MG PO CP24
120.0000 mg | ORAL_CAPSULE | Freq: Every day | ORAL | Status: DC
  Administered 2023-02-08: 120 mg via ORAL
  Filled 2023-02-08: qty 1

## 2023-02-08 MED ORDER — MAGNESIUM OXIDE -MG SUPPLEMENT 400 (240 MG) MG PO TABS
800.0000 mg | ORAL_TABLET | ORAL | 0 refills | Status: DC
  Filled 2023-02-08: qty 4, 1d supply, fill #0

## 2023-02-08 MED ORDER — APIXABAN 5 MG PO TABS
5.0000 mg | ORAL_TABLET | Freq: Two times a day (BID) | ORAL | 0 refills | Status: DC
  Filled 2023-02-08: qty 60, 30d supply, fill #0

## 2023-02-08 MED ORDER — MAGNESIUM SULFATE 50 % IJ SOLN
3.0000 g | Freq: Once | INTRAVENOUS | Status: DC
  Filled 2023-02-08: qty 6

## 2023-02-08 MED ORDER — MAGNESIUM OXIDE -MG SUPPLEMENT 400 (240 MG) MG PO TABS
800.0000 mg | ORAL_TABLET | ORAL | Status: AC
  Administered 2023-02-08 (×2): 800 mg via ORAL
  Filled 2023-02-08 (×2): qty 2

## 2023-02-08 MED ORDER — DILTIAZEM HCL ER COATED BEADS 120 MG PO CP24
120.0000 mg | ORAL_CAPSULE | Freq: Every day | ORAL | 0 refills | Status: DC
  Filled 2023-02-08: qty 90, 90d supply, fill #0

## 2023-02-08 MED ORDER — APIXABAN 5 MG PO TABS
5.0000 mg | ORAL_TABLET | Freq: Two times a day (BID) | ORAL | Status: DC
  Administered 2023-02-08: 5 mg via ORAL
  Filled 2023-02-08: qty 1

## 2023-02-08 MED ORDER — NICOTINE 21 MG/24HR TD PT24
21.0000 mg | MEDICATED_PATCH | Freq: Every day | TRANSDERMAL | 0 refills | Status: DC
  Filled 2023-02-08: qty 28, 28d supply, fill #0

## 2023-02-08 NOTE — Telephone Encounter (Signed)
error 

## 2023-02-08 NOTE — Progress Notes (Signed)
Rounding Note    Patient Name: Teresa Ford Date of Encounter: 02/08/2023   [facility] Cardiologist: Jodelle Red, MD   Subjective   Doing well, looking forward to going home. Remains in sinus rhythm  Inpatient Medications    Scheduled Meds:  apixaban  5 mg Oral BID   calcium-vitamin D  1 tablet Oral Daily   Chlorhexidine Gluconate Cloth  6 each Topical Daily   cholecalciferol  400 Units Oral Daily   diltiazem  120 mg Oral Daily   magnesium oxide  800 mg Oral Q4H   nicotine  21 mg Transdermal Daily   pantoprazole  40 mg Oral QHS   rosuvastatin  40 mg Oral Daily   sodium chloride flush  3 mL Intravenous Once   vitamin E  400 Units Oral Daily   Continuous Infusions:   PRN Meds: acetaminophen **OR** acetaminophen (TYLENOL) oral liquid 160 mg/5 mL **OR** acetaminophen, acetaminophen **OR** acetaminophen (TYLENOL) oral liquid 160 mg/5 mL **OR** acetaminophen, ipratropium-albuterol, senna-docusate   Vital Signs    Vitals:   02/07/23 2329 02/08/23 0323 02/08/23 0805 02/08/23 1130  BP: 117/77 107/60 128/68 (!) 130/91  Pulse: 72 70 74 74  Resp: 16 15 18 18   Temp: 98.8 F (37.1 C) 98.5 F (36.9 C) 97.9 F (36.6 C) 97.9 F (36.6 C)  TempSrc: Oral Oral Oral Oral  SpO2: 97% 96% 98% 92%  Weight:        Intake/Output Summary (Last 24 hours) at 02/08/2023 1212 Last data filed at 02/08/2023 0800 Gross per 24 hour  Intake 105.23 ml  Output --  Net 105.23 ml      02/04/2023    8:00 PM 07/26/2021    2:40 PM 04/08/2019   10:47 AM  Last 3 Weights  Weight (lbs) 141 lb 8.6 oz 137 lb 156 lb  Weight (kg) 64.2 kg 62.143 kg 70.761 kg      Telemetry    sinus - Personally Reviewed  ECG    No new since 5/15 - Personally Reviewed  Physical Exam   GEN: No acute distress.   Neck: No JVD Cardiac: RRR, no murmurs, rubs, or gallops.  Respiratory: Clear to auscultation bilaterally. GI: Soft, nontender, non-distended  MS: No edema; No  deformity. Neuro:  Nonfocal  Psych: Normal affect   Labs    High Sensitivity Troponin:  No results for input(s): "TROPONINIHS" in the last 720 hours.   Chemistry Recent Labs  Lab 02/04/23 2054 02/04/23 2103 02/05/23 0618 02/07/23 1155  NA 137 140 136  --   K 2.8* 2.9* 4.0  --   CL 105 104 105  --   CO2 21*  --  23  --   GLUCOSE 117* 112* 229*  --   BUN 21 22 23   --   CREATININE 0.87 1.00 0.91  --   CALCIUM 7.8*  --  8.6*  --   MG  --   --   --  1.5*  PROT 4.8*  --   --   --   ALBUMIN 2.8*  --   --   --   AST 24  --   --   --   ALT 14  --   --   --   ALKPHOS 43  --   --   --   BILITOT 0.4  --   --   --   GFRNONAA >60  --  >60  --   ANIONGAP 11  --  8  --  Lipids  Recent Labs  Lab 02/05/23 0618  CHOL 126  TRIG 28  HDL 54  LDLCALC 66  CHOLHDL 2.3    Hematology Recent Labs  Lab 02/04/23 2054 02/04/23 2103 02/05/23 0618  WBC 5.8  --  8.4  RBC 3.46*  --  3.42*  HGB 12.1 11.6* 11.9*  HCT 35.0* 34.0* 34.3*  MCV 101.2*  --  100.3*  MCH 35.0*  --  34.8*  MCHC 34.6  --  34.7  RDW 12.0  --  12.0  PLT 217  --  206   Thyroid  Recent Labs  Lab 02/07/23 1155  TSH 1.350    BNPNo results for input(s): "BNP", "PROBNP" in the last 168 hours.  DDimer No results for input(s): "DDIMER" in the last 168 hours.   Radiology    CT HEAD WO CONTRAST ( )  Result Date: 02/06/2023 CLINICAL DATA:  Stroke, follow-up after thrombectomy EXAM: CT HEAD WITHOUT CONTRAST TECHNIQUE: Contiguous axial images were obtained from the base of the skull through the vertex without intravenous contrast. RADIATION DOSE REDUCTION: This exam was performed according to the departmental dose-optimization program which includes automated exposure control, adjustment of the mA and/or kV according to patient size and/or use of iterative reconstruction technique. COMPARISON:  02/04/2023 CT head and 02/04/2023 IR CT head, correlation is also made with MRI head 02/05/2023 FINDINGS: Brain: Small focus  of hyperdensity in the anterior left temporal lobe (series 3, image 13), consistent with the small focus of hemorrhage noted on the prior MRI. Additional trace hyperdensity in the in the posterior left frontal sulci, likely trace subarachnoid hemorrhage (series 3, image 17), which is more apparent on the prior MRI the small focus of intraventricular hemorrhage noted on the MRI is not apparent on CT. Gray-white differentiation is preserved. A small area of hypodensity in the posterior left lentiform nucleus (series 3, image 17), may correlate with the left basal ganglia infarct seen on MRI. The other acute infarcts are not apparent. No mass effect or midline shift. No hydrocephalus. Vascular: No hyperdense vessel. Skull: Negative for fracture or focal lesion. Sinuses/Orbits: No acute finding. Other: The mastoid air cells are well aerated. IMPRESSION: 1. Small focus of hemorrhage in the anterior left temporal lobe, which appears similar to the 02/05/2023 MRI, when accounting for differences in technique. Additional trace subarachnoid hemorrhage in the posterior left frontal sulci is also more apparent on the prior MRI. 2. A small area of hypodensity in the posterior left lentiform nucleus, may correlate with the left basal ganglia infarct seen on the prior MRI. The other acute infarcts are not apparent on CT. No mass effect or midline shift. Electronically Signed   By: Wiliam Ke M.D.   On: 02/06/2023 15:56    Cardiac Studies   Cardiac Studies & Procedures     STRESS TESTS  MYOCARDIAL PERFUSION IMAGING 10/25/2015  Narrative  Nuclear stress EF: 77%. Apical septal akinesis  There was no ST segment deviation noted during stress.  Defect 1: There is a medium defect of severe severity present in the apical anterior and apex location.  Findings consistent with prior myocardial infarction.  This is a low risk study.   ECHOCARDIOGRAM  ECHOCARDIOGRAM COMPLETE 02/05/2023  Narrative ECHOCARDIOGRAM  REPORT    Patient Name:   WILLO DAMON Date of Exam: 02/05/2023 Medical Rec #:  [id number]          Height:       62.5 in Accession #:    [id number]  Weight:       141.5 lb Date of Birth:  06/22/1946          BSA:          1.660 m Patient Age:    77 years           BP:           122/84 mmHg Patient Gender: F                  HR:           66 bpm. Exam Location:  Inpatient  Procedure: 2D Echo, Cardiac Doppler and Color Doppler  Indications:    Stroke I63.9  History:        Patient has no prior history of Echocardiogram examinations. Stroke; Risk Factors:Dyslipidemia and Current Smoker.  Sonographer:    Lucendia Herrlich Referring Phys: [phone] ERIC LINDZEN  IMPRESSIONS   1. Left ventricular ejection fraction, by estimation, is 60 to 65%. The left ventricle has normal function. The left ventricle has no regional wall motion abnormalities. Left ventricular diastolic parameters are indeterminate. 2. Right ventricular systolic function is normal. The right ventricular size is normal. 3. The mitral valve is degenerative. Mild mitral valve regurgitation. No evidence of mitral stenosis. 4. The aortic valve is normal in structure. Aortic valve regurgitation is not visualized. No aortic stenosis is present. 5. The inferior vena cava is normal in size with greater than 50% respiratory variability, suggesting right atrial pressure of 3 mmHg.  FINDINGS Left Ventricle: Left ventricular ejection fraction, by estimation, is 60 to 65%. The left ventricle has normal function. The left ventricle has no regional wall motion abnormalities. The left ventricular internal cavity size was normal in size. There is no left ventricular hypertrophy. Left ventricular diastolic parameters are indeterminate.  Right Ventricle: The right ventricular size is normal. No increase in right ventricular wall thickness. Right ventricular systolic function is normal.  Left Atrium: Left atrial size was normal in  size.  Right Atrium: Right atrial size was normal in size.  Pericardium: There is no evidence of pericardial effusion. Presence of epicardial fat layer.  Mitral Valve: The mitral valve is degenerative in appearance. Mild to moderate mitral annular calcification. Mild mitral valve regurgitation. No evidence of mitral valve stenosis.  Tricuspid Valve: The tricuspid valve is normal in structure. Tricuspid valve regurgitation is not demonstrated. No evidence of tricuspid stenosis.  Aortic Valve: The aortic valve is normal in structure. Aortic valve regurgitation is not visualized. No aortic stenosis is present. Aortic valve peak gradient measures 11.3 mmHg.  Pulmonic Valve: The pulmonic valve was normal in structure. Pulmonic valve regurgitation is not visualized. No evidence of pulmonic stenosis.  Aorta: The aortic root is normal in size and structure.  Venous: The inferior vena cava is normal in size with greater than 50% respiratory variability, suggesting right atrial pressure of 3 mmHg.  IAS/Shunts: No atrial level shunt detected by color flow Doppler.   LEFT VENTRICLE PLAX 2D LVIDd:         4.20 cm   Diastology LVIDs:         2.80 cm   LV e' medial:    9.48 cm/s LV PW:         0.90 cm   LV E/e' medial:  11.9 LV IVS:        0.70 cm   LV e' lateral:   10.60 cm/s LVOT diam:     2.00 cm   LV E/e' lateral:  10.7 LV SV:         69 LV SV Index:   42 LVOT Area:     3.14 cm   RIGHT VENTRICLE             IVC RV S prime:     13.50 cm/s  IVC diam: 2.00 cm TAPSE (M-mode): 2.4 cm  LEFT ATRIUM           Index        RIGHT ATRIUM           Index LA diam:      3.70 cm 2.23 cm/m   RA Area:     15.45 cm LA Vol (A4C): 38.6 ml 23.22 ml/m  RA Volume:   37.90 ml  22.83 ml/m AORTIC VALVE AV Area (Vmax): 1.93 cm AV Vmax:        168.00 cm/s AV Peak Grad:   11.3 mmHg LVOT Vmax:      103.47 cm/s LVOT Vmean:     67.267 cm/s LVOT VTI:       0.219 m  AORTA Ao Asc diam: 2.67 cm  MITRAL  VALVE                TRICUSPID VALVE MV Area (PHT): 5.13 cm     TR Peak grad:   30.5 mmHg MV Decel Time: 148 msec     TR Vmax:        276.00 cm/s MV E velocity: 113.00 cm/s MV A velocity: 73.30 cm/s   SHUNTS MV E/A ratio:  1.54         Systemic VTI:  0.22 m Systemic Diam: 2.00 cm  Kardie Tobb DO Electronically signed by Thomasene Ripple DO Signature Date/Time: 02/05/2023/1:23:43 PM    Final              Patient Profile     77 y.o. female without prior known CV disease, history of hypertension, hyperlipidemia, tobacco use. She presented with acute CVA. This admission, she had new onset atrial fibrillation with RVR, and cardiology was consulted.   Assessment & Plan    Atrial fibrillation CVA -CHA2DS2/VAS Stroke Risk Points = 8 -converting to 120 mg daily diltiazem dose, stop IV diltiazem when oral administered -per neurology, plan to start apixaban today -echo unremarkable  Hypertension -well controlled on dilitazem alone, do not restart losartan/hctz at this time  [facility] will sign off.   Medication Recommendations:  apixaban, diltiazem as currently ordered. Do not restart losartan/HCTZ at this time Other recommendations (labs, testing, etc):  They will monitor home BP and contact us if consistent >150 systolic Follow up as an outpatient:  Has appointment with Gillian Shields, NP on 02/26/23 at 2:20 PM  For questions or updates, please contact [facility] Please consult [email or web address] for contact info under        Signed, Jodelle Red, MD  02/08/2023, 12:12 PM

## 2023-02-08 NOTE — Discharge Summary (Addendum)
Stroke Discharge Summary  Patient ID: Teresa Ford   MRN: [id number]      DOB: 03-02-1946  Date of Admission: 02/04/2023 Date of Discharge: 02/08/2023  Attending Physician:  Stroke, Md, MD, Stroke MD Consultant(s):    cardiology  Patient's PCP:  Darrow Bussing, MD  DISCHARGE DIAGNOSIS:  Stroke: Acute small patchy left MCA infarct due to left M1 occlusion s/p TNK and IR with TICI 3, etiology likely due to new diagnosed A-fib   Active Problems:   New onset atrial fibrillation with RVR (HCC)   Hyperlipidemia   Tobacco use disorder   Essential hypertension   COPD exacerbation (HCC)   Generalized anxiety disorder   Depression     Allergies as of 02/08/2023       Reactions   Lamisil [terbinafine] Rash        Medication List     STOP taking these medications    aspirin 81 MG tablet   FISH OIL PO   hydrochlorothiazide 25 MG tablet Commonly known as: HYDRODIURIL   losartan 50 MG tablet Commonly known as: COZAAR       TAKE these medications    apixaban 5 MG Tabs tablet Commonly known as: ELIQUIS Take 1 tablet (5 mg total) by mouth 2 (two) times daily.   bimatoprost 0.01 % Soln Commonly known as: LUMIGAN Place 1 drop into both eyes at bedtime.   CALCIUM 600/VITAMIN D PO Take 2 tablets by mouth daily.   CENTRUM SILVER PO Take 1 tablet by mouth daily.   Cequa 0.09 % Soln Generic drug: cycloSPORINE (PF) Place 1 drop into both eyes 2 (two) times daily.   diltiazem 120 MG 24 hr capsule Commonly known as: CARDIZEM CD Take 1 capsule (120 mg total) by mouth daily. Start taking on: Feb 09, 2023   Incruse Ellipta 62.5 MCG/ACT Aepb Generic drug: umeclidinium bromide Inhale 1 puff into the lungs daily.   loratadine 10 MG tablet Commonly known as: CLARITIN Take 10 mg by mouth daily.   magnesium oxide 400 (240 Mg) MG tablet Commonly known as: MAG-OX Take 2 tablets (800 mg total) by mouth every 4 (four) hours.   nicotine 21 mg/24hr patch Commonly  known as: NICODERM CQ - dosed in mg/24 hours Place 1 patch (21 mg total) onto the skin daily. Start taking on: Feb 09, 2023   rosuvastatin 40 MG tablet Commonly known as: CRESTOR Take 40 mg by mouth daily.   VITAMIN D PO Take 1 tablet by mouth daily.   vitamin E 180 MG (400 UNITS) capsule Take 400 Units by mouth daily.        LABORATORY STUDIES CBC    Component Value Date/Time   WBC 8.4 02/05/2023 0618   RBC 3.42 (L) 02/05/2023 0618   HGB 11.9 (L) 02/05/2023 0618   HCT 34.3 (L) 02/05/2023 0618   PLT 206 02/05/2023 0618   MCV 100.3 (H) 02/05/2023 0618   MCH 34.8 (H) 02/05/2023 0618   MCHC 34.7 02/05/2023 0618   RDW 12.0 02/05/2023 0618   LYMPHSABS 0.4 (L) 02/05/2023 0618   MONOABS 0.1 02/05/2023 0618   EOSABS 0.0 02/05/2023 0618   BASOSABS 0.0 02/05/2023 0618   CMP    Component Value Date/Time   NA 136 02/05/2023 0618   K 4.0 02/05/2023 0618   CL 105 02/05/2023 0618   CO2 23 02/05/2023 0618   GLUCOSE 229 (H) 02/05/2023 0618   BUN 23 02/05/2023 0618   CREATININE 0.91 02/05/2023 0618  CALCIUM 8.6 (L) 02/05/2023 0618   PROT 4.8 (L) 02/04/2023 2054   ALBUMIN 2.8 (L) 02/04/2023 2054   AST 24 02/04/2023 2054   ALT 14 02/04/2023 2054   ALKPHOS 43 02/04/2023 2054   BILITOT 0.4 02/04/2023 2054   GFRNONAA >60 02/05/2023 0618   COAGS Lab Results  Component Value Date   INR 1.0 02/04/2023   Lipid Panel    Component Value Date/Time   CHOL 126 02/05/2023 0618   TRIG 28 02/05/2023 0618   HDL 54 02/05/2023 0618   CHOLHDL 2.3 02/05/2023 0618   VLDL 6 02/05/2023 0618   LDLCALC 66 02/05/2023 0618   HgbA1C  Lab Results  Component Value Date   HGBA1C 5.0 02/05/2023   Urinalysis No results found for: "COLORURINE", "APPEARANCEUR", "LABSPEC", "PHURINE", "GLUCOSEU", "HGBUR", "BILIRUBINUR", "KETONESUR", "PROTEINUR", "UROBILINOGEN", "NITRITE", "LEUKOCYTESUR" Urine Drug Screen No results found for: "LABOPIA", "COCAINSCRNUR", "LABBENZ", "AMPHETMU", "THCU", "LABBARB"   Alcohol Level    Component Value Date/Time   ETH 100 (H) 02/04/2023 2054     SIGNIFICANT DIAGNOSTIC STUDIES CT HEAD WO CONTRAST ( )  Result Date: 02/06/2023 CLINICAL DATA:  Stroke, follow-up after thrombectomy EXAM: CT HEAD WITHOUT CONTRAST TECHNIQUE: Contiguous axial images were obtained from the base of the skull through the vertex without intravenous contrast. RADIATION DOSE REDUCTION: This exam was performed according to the departmental dose-optimization program which includes automated exposure control, adjustment of the mA and/or kV according to patient size and/or use of iterative reconstruction technique. COMPARISON:  02/04/2023 CT head and 02/04/2023 IR CT head, correlation is also made with MRI head 02/05/2023 FINDINGS: Brain: Small focus of hyperdensity in the anterior left temporal lobe (series 3, image 13), consistent with the small focus of hemorrhage noted on the prior MRI. Additional trace hyperdensity in the in the posterior left frontal sulci, likely trace subarachnoid hemorrhage (series 3, image 17), which is more apparent on the prior MRI the small focus of intraventricular hemorrhage noted on the MRI is not apparent on CT. Gray-white differentiation is preserved. A small area of hypodensity in the posterior left lentiform nucleus (series 3, image 17), may correlate with the left basal ganglia infarct seen on MRI. The other acute infarcts are not apparent. No mass effect or midline shift. No hydrocephalus. Vascular: No hyperdense vessel. Skull: Negative for fracture or focal lesion. Sinuses/Orbits: No acute finding. Other: The mastoid air cells are well aerated. IMPRESSION: 1. Small focus of hemorrhage in the anterior left temporal lobe, which appears similar to the 02/05/2023 MRI, when accounting for differences in technique. Additional trace subarachnoid hemorrhage in the posterior left frontal sulci is also more apparent on the prior MRI. 2. A small area of hypodensity in the  posterior left lentiform nucleus, may correlate with the left basal ganglia infarct seen on the prior MRI. The other acute infarcts are not apparent on CT. No mass effect or midline shift. Electronically Signed   By: Wiliam Ke M.D.   On: 02/06/2023 15:56   IR PERCUTANEOUS ART THROMBECTOMY/INFUSION INTRACRANIAL INC DIAG ANGIO  Result Date: 02/06/2023 INDICATION: New onset aphasia, left-sided gaze deviation and right-sided hemiplegia. Occluded left middle cerebral artery on CT angiogram of the head and neck. EXAM: 1. EMERGENT LARGE VESSEL OCCLUSION THROMBOLYSIS (anterior CIRCULATION) COMPARISON:  CT angiogram of the head and neck of Feb 04, 2023. MEDICATIONS: Ancef 2 g IV antibiotic was administered within 1 hour of the procedure. ANESTHESIA/SEDATION: General anesthesia. CONTRAST:  Omnipaque 300 approximately 70 mL. FLUOROSCOPY TIME:  Fluoroscopy Time: 11 minutes 18 seconds (776  mGy). COMPLICATIONS: None immediate. TECHNIQUE: Following a full explanation of the procedure along with the potential associated complications, an informed witnessed consent was obtained. The risks of intracranial hemorrhage of 10%, worsening neurological deficit, ventilator dependency, death and inability to revascularize were all reviewed in detail with the patient's daughter. The patient was then put under general anesthesia by the Department of Anesthesiology at [facility]. The right groin was prepped and draped in the usual sterile fashion. Thereafter using modified Seldinger technique, transfemoral access into the right common femoral artery was obtained without difficulty. Over an 0.035 inch guidewire an 8 French 25 cm Pinnacle sheath was inserted. Through this, and also over an 0.035 inch guidewire a combination of a 125 cm 5.5 French Simmons 2 support catheter inside of an 087 95 cm balloon guide catheter was advanced to the aortic arch region and positioned in the left common carotid artery and then the left  internal carotid artery. FINDINGS: The left common carotid arteriogram demonstrates the left external carotid artery and its major branches to be widely patent. The left internal carotid artery at the bulb demonstrates a soft shallow plaque along the posterior wall without resulting stenosis, or of ulcerations. The vessel is seen to opacify to the cranial skull base. The petrous, the cavernous and the supraclinoid left ICA demonstrate wide patency. Patency is seen of the petrous, cavernous and supraclinoid left ICA. A left posterior communicating artery is seen opacifying the left posterior cerebral artery distribution with transient retrograde flow into the distal basilar artery. The left middle cerebral artery demonstrates complete occlusion in its mid M1 segment. The left anterior cerebral artery opacifies into the capillary and venous phases. PROCEDURE: Through the balloon guide catheter in the distal cervical left ICA, a combination of an 071 2 cm Zoom aspiration catheter inside of which was a 160 cm microcatheter was advanced over an 018 inch micro guidewire with moderate J configuration to the supraclinoid left ICA. The micro guidewire was then advanced using a torque device into the distal M2 M3 segment of the left middle cerebral artery inferior division followed by the microcatheter. The guidewire was removed. Good aspiration obtained from the hub of the microcatheter which was then connected to continuous heparinized saline infusion. A 3 mm x 40 mm Solitaire X retrieval device was then advanced to the distal end of the microcatheter and deployed in the usual manner without difficulty. The proximal portion of the device was seen just proximal to the thrombus. At this time, the Zoom aspiration catheter was advanced and engaged into the proximal portion of the clot. Constant aspiration was then applied at the hub of the Zoom aspiration catheter for 2 minutes with a Penumbra aspiration device, and a 20 mL  syringe at the hub of the balloon guide catheter. The combination of the retrieval device, the Zoom aspiration catheter, and the microcatheter was removed. Following reversal of flow arrest, a control arteriogram performed through the balloon guide catheter in the left internal carotid artery demonstrated complete revascularization of the left middle cerebral artery distribution with patency maintained of the left posterior communicating artery in the left anterior cerebral artery. A TICI 3 revascularization of the MCA was obtained. The balloon guide catheter was then retrieved proximally in the left common carotid artery. A control arteriogram performed through this demonstrated the left internal carotid artery extra cranially and intracranially to be widely patent. The left MCA and the left anterior cerebral artery distributions continued to show patency without evidence of intraluminal  filling defects, or of occlusions. The balloon guide was removed. A 7 French Angio-Seal closure device was then deployed for hemostasis in addition to manual compression with quick clot for approximately 20 minutes. Distal pulses remained Dopplerable in both feet unchanged from prior to the procedure. A flat panel CT of the brain demonstrated no evidence of intracranial hemorrhage. The patient was extubated. Upon recovery, the patient was able to follow simple commands appropriately. She was able to move her right arm and leg against gravity. Tongue was midline, pupils were equal bilaterally unchanged. She was then transferred to the neuro ICU for post revascularization management. IMPRESSION: Status post complete revascularization of occluded left MCA distal M1 segment with 1 pass with a 3 mm x 40 mm Solitaire X retrieval device, and contact aspiration achieving a TICI 3 revascularization. PLAN: Follow-up as per referring MD. Electronically Signed   By: Julieanne Cotton M.D.   On: 02/06/2023 09:42   IR CT Head Ltd  Result  Date: 02/06/2023 INDICATION: New onset aphasia, left-sided gaze deviation and right-sided hemiplegia. Occluded left middle cerebral artery on CT angiogram of the head and neck. EXAM: 1. EMERGENT LARGE VESSEL OCCLUSION THROMBOLYSIS (anterior CIRCULATION) COMPARISON:  CT angiogram of the head and neck of Feb 04, 2023. MEDICATIONS: Ancef 2 g IV antibiotic was administered within 1 hour of the procedure. ANESTHESIA/SEDATION: General anesthesia. CONTRAST:  Omnipaque 300 approximately 70 mL. FLUOROSCOPY TIME:  Fluoroscopy Time: 11 minutes 18 seconds (776 mGy). COMPLICATIONS: None immediate. TECHNIQUE: Following a full explanation of the procedure along with the potential associated complications, an informed witnessed consent was obtained. The risks of intracranial hemorrhage of 10%, worsening neurological deficit, ventilator dependency, death and inability to revascularize were all reviewed in detail with the patient's daughter. The patient was then put under general anesthesia by the Department of Anesthesiology at [facility]. The right groin was prepped and draped in the usual sterile fashion. Thereafter using modified Seldinger technique, transfemoral access into the right common femoral artery was obtained without difficulty. Over an 0.035 inch guidewire an 8 French 25 cm Pinnacle sheath was inserted. Through this, and also over an 0.035 inch guidewire a combination of a 125 cm 5.5 French Simmons 2 support catheter inside of an 087 95 cm balloon guide catheter was advanced to the aortic arch region and positioned in the left common carotid artery and then the left internal carotid artery. FINDINGS: The left common carotid arteriogram demonstrates the left external carotid artery and its major branches to be widely patent. The left internal carotid artery at the bulb demonstrates a soft shallow plaque along the posterior wall without resulting stenosis, or of ulcerations. The vessel is seen to opacify to the  cranial skull base. The petrous, the cavernous and the supraclinoid left ICA demonstrate wide patency. Patency is seen of the petrous, cavernous and supraclinoid left ICA. A left posterior communicating artery is seen opacifying the left posterior cerebral artery distribution with transient retrograde flow into the distal basilar artery. The left middle cerebral artery demonstrates complete occlusion in its mid M1 segment. The left anterior cerebral artery opacifies into the capillary and venous phases. PROCEDURE: Through the balloon guide catheter in the distal cervical left ICA, a combination of an 071 2 cm Zoom aspiration catheter inside of which was a 160 cm microcatheter was advanced over an 018 inch micro guidewire with moderate J configuration to the supraclinoid left ICA. The micro guidewire was then advanced using a torque device into the distal M2 M3  segment of the left middle cerebral artery inferior division followed by the microcatheter. The guidewire was removed. Good aspiration obtained from the hub of the microcatheter which was then connected to continuous heparinized saline infusion. A 3 mm x 40 mm Solitaire X retrieval device was then advanced to the distal end of the microcatheter and deployed in the usual manner without difficulty. The proximal portion of the device was seen just proximal to the thrombus. At this time, the Zoom aspiration catheter was advanced and engaged into the proximal portion of the clot. Constant aspiration was then applied at the hub of the Zoom aspiration catheter for 2 minutes with a Penumbra aspiration device, and a 20 mL syringe at the hub of the balloon guide catheter. The combination of the retrieval device, the Zoom aspiration catheter, and the microcatheter was removed. Following reversal of flow arrest, a control arteriogram performed through the balloon guide catheter in the left internal carotid artery demonstrated complete revascularization of the left middle  cerebral artery distribution with patency maintained of the left posterior communicating artery in the left anterior cerebral artery. A TICI 3 revascularization of the MCA was obtained. The balloon guide catheter was then retrieved proximally in the left common carotid artery. A control arteriogram performed through this demonstrated the left internal carotid artery extra cranially and intracranially to be widely patent. The left MCA and the left anterior cerebral artery distributions continued to show patency without evidence of intraluminal filling defects, or of occlusions. The balloon guide was removed. A 7 French Angio-Seal closure device was then deployed for hemostasis in addition to manual compression with quick clot for approximately 20 minutes. Distal pulses remained Dopplerable in both feet unchanged from prior to the procedure. A flat panel CT of the brain demonstrated no evidence of intracranial hemorrhage. The patient was extubated. Upon recovery, the patient was able to follow simple commands appropriately. She was able to move her right arm and leg against gravity. Tongue was midline, pupils were equal bilaterally unchanged. She was then transferred to the neuro ICU for post revascularization management. IMPRESSION: Status post complete revascularization of occluded left MCA distal M1 segment with 1 pass with a 3 mm x 40 mm Solitaire X retrieval device, and contact aspiration achieving a TICI 3 revascularization. PLAN: Follow-up as per referring MD. Electronically Signed   By: Julieanne Cotton M.D.   On: 02/06/2023 09:42   MR BRAIN WO CONTRAST  Result Date: 02/05/2023 CLINICAL DATA:  Follow-up examination for stroke. EXAM: MRI HEAD WITHOUT CONTRAST MRA HEAD WITHOUT CONTRAST TECHNIQUE: Multiplanar, multi-echo pulse sequences of the brain and surrounding structures were acquired without intravenous contrast. Angiographic images of the Circle of Willis were acquired using MRA technique without  intravenous contrast. COMPARISON:  Prior studies from 02/04/2023. FINDINGS: MRI HEAD FINDINGS Brain: Cerebral volume within normal limits for age. Patchy T2/FLAIR hyperintensity involving the periventricular and deep white matter both cerebral hemispheres, most likely related chronic microvascular ischemic disease, mild for age. Few scattered subtle foci of diffusion signal abnormality are seen involving the left MCA distribution. Involvement of the left basal ganglia (series 2, images 33, 29). Minimal patchy cortical involvement at the overlying left frontal and temporal lobes (series 2, images 35, 27). Scattered superimposed FLAIR hyperintensity with susceptibility artifact present in this region (series 7, images 20). Additional signal abnormality noted at the level of the left MCA bifurcation (series 8, image 42). Findings highly suspicious for superimposed small volume acute subarachnoid hemorrhage. Probable trace read distributed blood at the  right occipital horn noted (series 8, image 50). No other evidence for acute or subacute ischemia. No other acute or chronic intracranial blood products. No mass lesion or midline shift. No hydrocephalus or extra-axial fluid collection. Pituitary gland and suprasellar region within normal limits. Vascular: Major intracranial vascular flow voids are maintained. Skull and upper cervical spine: Craniocervical junction within normal limits. Bone marrow signal intensity normal. No scalp soft tissue abnormality. Sinuses/Orbits: Globes and orbital soft tissues within normal limits. Paranasal sinuses are largely clear. No mastoid effusion. Other: None. MRA HEAD FINDINGS Anterior circulation: Mild atheromatous irregularity about the carotid siphons without hemodynamically significant stenosis. A1 segments patent bilaterally. Normal anterior communicating complex. Anterior cerebral arteries patent without stenosis. Interval revascularization of previously seen left M1 occlusion.  Left M1 segment now widely patent. Right M1 remains patent. No proximal MCA branch occlusion or high-grade stenosis. Distal MCA branches perfused and fairly symmetric. Posterior circulation: Both vertebral arteries patent without stenosis. Left vertebral artery slightly dominant. Both PICA patent. Basilar patent without stenosis. Superior cerebral arteries patent bilaterally. Both PCAs primarily supplied via the basilar well perfused or distal aspects. Prominent left posterior communicating artery noted. Anatomic variants: None significant.  No aneurysm. IMPRESSION: MRI HEAD IMPRESSION: 1. 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 No significant mass effect. 2. Probable scattered small volume acute subarachnoid hemorrhage about the left sylvian fissure. 3. Underlying mild chronic microvascular ischemic disease. MRA HEAD IMPRESSION: 1. Interval revascularization of previously seen left M1 occlusion. Left M1 segment now widely patent. No large vessel occlusion as seen. 2. Mild intracranial atherosclerotic disease, primarily involving the carotid siphons. No hemodynamically significant or correctable stenosis. These results were communicated to Dr. Otelia Limes at 9:33 pm on 02/05/2023 by text page via the [facility] messaging system. Electronically Signed   By: Rise Mu M.D.   On: 02/05/2023 21:35   MR ANGIO HEAD WO CONTRAST  Result Date: 02/05/2023 CLINICAL DATA:  Follow-up examination for stroke. EXAM: MRI HEAD WITHOUT CONTRAST MRA HEAD WITHOUT CONTRAST TECHNIQUE: Multiplanar, multi-echo pulse sequences of the brain and surrounding structures were acquired without intravenous contrast. Angiographic images of the Circle of Willis were acquired using MRA technique without intravenous contrast. COMPARISON:  Prior studies from 02/04/2023. FINDINGS: MRI HEAD FINDINGS Brain: Cerebral volume within normal limits for age. Patchy T2/FLAIR hyperintensity involving the periventricular and deep  white matter both cerebral hemispheres, most likely related chronic microvascular ischemic disease, mild for age. Few scattered subtle foci of diffusion signal abnormality are seen involving the left MCA distribution. Involvement of the left basal ganglia (series 2, images 33, 29). Minimal patchy cortical involvement at the overlying left frontal and temporal lobes (series 2, images 35, 27). Scattered superimposed FLAIR hyperintensity with susceptibility artifact present in this region (series 7, images 20). Additional signal abnormality noted at the level of the left MCA bifurcation (series 8, image 42). Findings highly suspicious for superimposed small volume acute subarachnoid hemorrhage. Probable trace read distributed blood at the right occipital horn noted (series 8, image 50). No other evidence for acute or subacute ischemia. No other acute or chronic intracranial blood products. No mass lesion or midline shift. No hydrocephalus or extra-axial fluid collection. Pituitary gland and suprasellar region within normal limits. Vascular: Major intracranial vascular flow voids are maintained. Skull and upper cervical spine: Craniocervical junction within normal limits. Bone marrow signal intensity normal. No scalp soft tissue abnormality. Sinuses/Orbits: Globes and orbital soft tissues within normal limits. Paranasal sinuses are largely clear. No mastoid effusion. Other: None.  MRA HEAD FINDINGS Anterior circulation: Mild atheromatous irregularity about the carotid siphons without hemodynamically significant stenosis. A1 segments patent bilaterally. Normal anterior communicating complex. Anterior cerebral arteries patent without stenosis. Interval revascularization of previously seen left M1 occlusion. Left M1 segment now widely patent. Right M1 remains patent. No proximal MCA branch occlusion or high-grade stenosis. Distal MCA branches perfused and fairly symmetric. Posterior circulation: Both vertebral arteries  patent without stenosis. Left vertebral artery slightly dominant. Both PICA patent. Basilar patent without stenosis. Superior cerebral arteries patent bilaterally. Both PCAs primarily supplied via the basilar well perfused or distal aspects. Prominent left posterior communicating artery noted. Anatomic variants: None significant.  No aneurysm. IMPRESSION: MRI HEAD IMPRESSION: 1. 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 No significant mass effect. 2. Probable scattered small volume acute subarachnoid hemorrhage about the left sylvian fissure. 3. Underlying mild chronic microvascular ischemic disease. MRA HEAD IMPRESSION: 1. Interval revascularization of previously seen left M1 occlusion. Left M1 segment now widely patent. No large vessel occlusion as seen. 2. Mild intracranial atherosclerotic disease, primarily involving the carotid siphons. No hemodynamically significant or correctable stenosis. These results were communicated to Dr. Otelia Limes at 9:33 pm on 02/05/2023 by text page via the [facility] messaging system. Electronically Signed   By: Rise Mu M.D.   On: 02/05/2023 21:35   VAS [place] GROIN PSEUDOANEURYSM  Result Date: 02/05/2023  ARTERIAL PSEUDOANEURYSM  Patient Name:  GETRUDE CANSLER  Date of Exam:   02/05/2023 Medical Rec #: [id number]           Accession #:    [id number] Date of Birth: 08-02-1946           Patient Gender: F Patient Age:   59 years Exam Location:  [facility] Procedure:      VAS [place] Bobetta Lime Referring Phys: Lawernce Ion --------------------------------------------------------------------------------  Exam: Right groin Indications: Patient complains of bruising and bleeding. History: S/p catheterization. Comparison Study: No prior studies. Performing Technologist: Jean Rosenthal RDMS, RVT  Examination Guidelines: A complete evaluation includes B-mode imaging, spectral Doppler, color Doppler, and power Doppler as needed of all accessible portions  of each vessel. Bilateral testing is considered an integral part of a complete examination. Limited examinations for reoccurring indications may be performed as noted. +------------+----------+--------+------+--------------------+ Right DuplexPSV (cm/s)WaveformPlaque     Comment(s)      +------------+----------+--------+------+--------------------+ CFA            281                  Stenotic mid segment +------------+----------+--------+------+--------------------+ PFA            334                   Stenotic at origin  +------------+----------+--------+------+--------------------+ Prox SFA       174                                       +------------+----------+--------+------+--------------------+  Findings: A mixed echogenic structure measuring approximately 2.4 cm x 1.8 cm is visualized at the at the level of the mid common femoral artery with ultrasound characteristics of a hematoma. Arterial disease noted incidentally.  Diagnosing physician: Waverly Ferrari MD Electronically signed by Waverly Ferrari MD on 02/05/2023 at 6:12:07 PM.    --------------------------------------------------------------------------------    Final    VAS [place] LOWER EXTREMITY VENOUS (DVT)  Result Date: 02/05/2023  Lower Venous  DVT Study Patient Name:  SHAELEY YAKLIN  Date of Exam:   02/05/2023 Medical Rec #: [id number]           Accession #:    [id number] Date of Birth: [birth year]           Patient Gender: F Patient Age:   89 years Exam Location:  [facility] Procedure:      VAS [place] LOWER EXTREMITY VENOUS (DVT) Referring Phys: Scheryl Marten Kya Mayfield --------------------------------------------------------------------------------  Indications: Embolic stroke.  Comparison Study: No prior studies. Performing Technologist: Jean Rosenthal RDMS, RVT  Examination Guidelines: A complete evaluation includes B-mode imaging, spectral Doppler, color Doppler, and power Doppler as needed of all accessible portions of  each vessel. Bilateral testing is considered an integral part of a complete examination. Limited examinations for reoccurring indications may be performed as noted. The reflux portion of the exam is performed with the patient in reverse Trendelenburg.  +---------+---------------+---------+-----------+----------+--------------+ RIGHT    CompressibilityPhasicitySpontaneityPropertiesThrombus Aging +---------+---------------+---------+-----------+----------+--------------+ CFV      Full           Yes      Yes                                 +---------+---------------+---------+-----------+----------+--------------+ SFJ      Full                                                        +---------+---------------+---------+-----------+----------+--------------+ FV Prox  Full                                                        +---------+---------------+---------+-----------+----------+--------------+ FV Mid   Full                                                        +---------+---------------+---------+-----------+----------+--------------+ FV DistalFull                                                        +---------+---------------+---------+-----------+----------+--------------+ PFV      Full                                                        +---------+---------------+---------+-----------+----------+--------------+ POP      Full           Yes      Yes                                 +---------+---------------+---------+-----------+----------+--------------+ PTV      Full                                                        +---------+---------------+---------+-----------+----------+--------------+  PERO     Full                                                        +---------+---------------+---------+-----------+----------+--------------+   +---------+---------------+---------+-----------+----------+--------------+ LEFT      CompressibilityPhasicitySpontaneityPropertiesThrombus Aging +---------+---------------+---------+-----------+----------+--------------+ CFV      Full           Yes      Yes                                 +---------+---------------+---------+-----------+----------+--------------+ SFJ      Full                                                        +---------+---------------+---------+-----------+----------+--------------+ FV Prox  Full                                                        +---------+---------------+---------+-----------+----------+--------------+ FV Mid   Full                                                        +---------+---------------+---------+-----------+----------+--------------+ FV DistalFull                                                        +---------+---------------+---------+-----------+----------+--------------+ PFV      Full                                                        +---------+---------------+---------+-----------+----------+--------------+ POP      Full           Yes      Yes                                 +---------+---------------+---------+-----------+----------+--------------+ PTV      Full                                                        +---------+---------------+---------+-----------+----------+--------------+ PERO     Full                                                        +---------+---------------+---------+-----------+----------+--------------+  Summary: RIGHT: - There is no evidence of deep vein thrombosis in the lower extremity.  - No cystic structure found in the popliteal fossa.  LEFT: - There is no evidence of deep vein thrombosis in the lower extremity.  - No cystic structure found in the popliteal fossa.  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 PSV 545 cm/s  *See table(s) above for measurements and observations. Electronically  signed by Waverly Ferrari MD on 02/05/2023 at 6:11:57 PM.    Final    ECHOCARDIOGRAM COMPLETE  Result Date: 02/05/2023    ECHOCARDIOGRAM REPORT   Patient Name:   Teresa Ford Date of Exam: 02/05/2023 Medical Rec #:  [id number]          Height:       62.5 in Accession #:    [id number]         Weight:       141.5 lb Date of Birth:  1946/06/20          BSA:          1.660 m Patient Age:    77 years           BP:           122/84 mmHg Patient Gender: F                  HR:           66 bpm. Exam Location:  Inpatient Procedure: 2D Echo, Cardiac Doppler and Color Doppler Indications:    Stroke I63.9  History:        Patient has no prior history of Echocardiogram examinations.                 Stroke; Risk Factors:Dyslipidemia and Current Smoker.  Sonographer:    Lucendia Herrlich Referring Phys: [phone] ERIC LINDZEN IMPRESSIONS  1. Left ventricular ejection fraction, by estimation, is 60 to 65%. The left ventricle has normal function. The left ventricle has no regional wall motion abnormalities. Left ventricular diastolic parameters are indeterminate.  2. Right ventricular systolic function is normal. The right ventricular size is normal.  3. The mitral valve is degenerative. Mild mitral valve regurgitation. No evidence of mitral stenosis.  4. The aortic valve is normal in structure. Aortic valve regurgitation is not visualized. No aortic stenosis is present.  5. The inferior vena cava is normal in size with greater than 50% respiratory variability, suggesting right atrial pressure of 3 mmHg. FINDINGS  Left Ventricle: Left ventricular ejection fraction, by estimation, is 60 to 65%. The left ventricle has normal function. The left ventricle has no regional wall motion abnormalities. The left ventricular internal cavity size was normal in size. There is  no left ventricular hypertrophy. Left ventricular diastolic parameters are indeterminate. Right Ventricle: The right ventricular size is normal. No increase in right  ventricular wall thickness. Right ventricular systolic function is normal. Left Atrium: Left atrial size was normal in size. Right Atrium: Right atrial size was normal in size. Pericardium: There is no evidence of pericardial effusion. Presence of epicardial fat layer. Mitral Valve: The mitral valve is degenerative in appearance. Mild to moderate mitral annular calcification. Mild mitral valve regurgitation. No evidence of mitral valve stenosis. Tricuspid Valve: The tricuspid valve is normal in structure. Tricuspid valve regurgitation is not demonstrated. No evidence of tricuspid stenosis. Aortic Valve: The aortic valve is normal in structure. Aortic valve regurgitation is not visualized. No aortic stenosis is present. Aortic valve peak  gradient measures 11.3 mmHg. Pulmonic Valve: The pulmonic valve was normal in structure. Pulmonic valve regurgitation is not visualized. No evidence of pulmonic stenosis. Aorta: The aortic root is normal in size and structure. Venous: The inferior vena cava is normal in size with greater than 50% respiratory variability, suggesting right atrial pressure of 3 mmHg. IAS/Shunts: No atrial level shunt detected by color flow Doppler.  LEFT VENTRICLE PLAX 2D LVIDd:         4.20 cm   Diastology LVIDs:         2.80 cm   LV e' medial:    9.48 cm/s LV PW:         0.90 cm   LV E/e' medial:  11.9 LV IVS:        0.70 cm   LV e' lateral:   10.60 cm/s LVOT diam:     2.00 cm   LV E/e' lateral: 10.7 LV SV:         69 LV SV Index:   42 LVOT Area:     3.14 cm  RIGHT VENTRICLE             IVC RV S prime:     13.50 cm/s  IVC diam: 2.00 cm TAPSE (M-mode): 2.4 cm LEFT ATRIUM           Index        RIGHT ATRIUM           Index LA diam:      3.70 cm 2.23 cm/m   RA Area:     15.45 cm LA Vol (A4C): 38.6 ml 23.22 ml/m  RA Volume:   37.90 ml  22.83 ml/m  AORTIC VALVE AV Area (Vmax): 1.93 cm AV Vmax:        168.00 cm/s AV Peak Grad:   11.3 mmHg LVOT Vmax:      103.47 cm/s LVOT Vmean:     67.267 cm/s LVOT  VTI:       0.219 m  AORTA Ao Asc diam: 2.67 cm MITRAL VALVE                TRICUSPID VALVE MV Area (PHT): 5.13 cm     TR Peak grad:   30.5 mmHg MV Decel Time: 148 msec     TR Vmax:        276.00 cm/s MV E velocity: 113.00 cm/s MV A velocity: 73.30 cm/s   SHUNTS MV E/A ratio:  1.54         Systemic VTI:  0.22 m                             Systemic Diam: 2.00 cm Kardie Tobb DO Electronically signed by Thomasene Ripple DO Signature Date/Time: 02/05/2023/1:23:43 PM    Final    CT HEAD CODE STROKE WO CONTRAST  Result Date: 02/04/2023 CLINICAL DATA:  Neuro deficit, acute, stroke suspected. Right-sided weakness. EXAM: CT HEAD WITHOUT CONTRAST CT ANGIOGRAPHY OF THE HEAD AND NECK TECHNIQUE: Contiguous axial images were obtained from the base of the skull through the vertex without intravenous contrast. Multidetector CT imaging of the head and neck was performed using the standard protocol during bolus administration of intravenous contrast. Multiplanar CT image reconstructions and MIPs were obtained to evaluate the vascular anatomy. Carotid stenosis measurements (when applicable) are obtained utilizing NASCET criteria, using the distal internal carotid diameter as the denominator. RADIATION DOSE REDUCTION: This exam was performed according to the  departmental dose-optimization program which includes automated exposure control, adjustment of the mA and/or kV according to patient size and/or use of iterative reconstruction technique. CONTRAST:  OMNIPAQUE IOHEXOL 350 MG/ML SOLN COMPARISON:  None Available. FINDINGS: CT HEAD Brain: No acute intracranial hemorrhage. Gray-white differentiation is preserved. No hydrocephalus or extra-axial collection. No mass effect or midline shift. ASPECT score is 10. Vascular: No hyperdense vessel or unexpected calcification. Skull: No calvarial fracture or suspicious bone lesion. Skull base is unremarkable. Sinuses/Orbits: Unremarkable. CTA NECK Aortic arch: Three-vessel arch  configuration. Atherosclerotic calcifications of the aortic arch and arch vessel origins. Arch vessel origins are patent. Right carotid system: Calcified plaque results in less than 50% stenosis of the proximal right cervical ICA. Left carotid system: Calcified plaque results in less than 50% stenosis of the proximal left cervical ICA. Vertebral arteries:Patent from the origin to the confluence with the basilar without stenosis or dissection. Skeleton: Mild cervical spondylosis without high-grade spinal canal stenosis. Other neck: Emphysema in the lung apices.  Otherwise unremarkable. CTA HEAD Anterior circulation: Calcified plaque along the carotid siphons without hemodynamically significant stenosis. Abrupt cut off of the left MCA, mid M1 segment. The right MCA and bilateral ACAs are patent without stenosis or aneurysm. Distal branches are symmetric. Posterior circulation: Normal basilar artery. The SCAs, AICAs and PICAs are patent proximally. The PCAs are patent proximally without stenosis or aneurysm. Distal branches are symmetric. Venous sinuses: Patent. Anatomic variants: None. IMPRESSION: 1. No acute intracranial hemorrhage. ASPECT score is 10. 2. Acute occlusion of the left MCA, mid M1 segment. Distal branches are symmetric. 3. No hemodynamically significant stenosis in the neck. Aortic Atherosclerosis (ICD10-I70.0) and Emphysema (ICD10-J43.9). Code stroke imaging results were communicated on 02/04/2023 at 9:28 pm to provider Dr. Otelia Limes via secure text paging. Electronically Signed   By: Orvan Falconer M.D.   On: 02/04/2023 21:34   CT ANGIO HEAD NECK W WO CM (CODE STROKE)  Result Date: 02/04/2023 CLINICAL DATA:  Neuro deficit, acute, stroke suspected. Right-sided weakness. EXAM: CT HEAD WITHOUT CONTRAST CT ANGIOGRAPHY OF THE HEAD AND NECK TECHNIQUE: Contiguous axial images were obtained from the base of the skull through the vertex without intravenous contrast. Multidetector CT imaging of the head and  neck was performed using the standard protocol during bolus administration of intravenous contrast. Multiplanar CT image reconstructions and MIPs were obtained to evaluate the vascular anatomy. Carotid stenosis measurements (when applicable) are obtained utilizing NASCET criteria, using the distal internal carotid diameter as the denominator. RADIATION DOSE REDUCTION: This exam was performed according to the departmental dose-optimization program which includes automated exposure control, adjustment of the mA and/or kV according to patient size and/or use of iterative reconstruction technique. CONTRAST:  OMNIPAQUE IOHEXOL 350 MG/ML SOLN COMPARISON:  None Available. FINDINGS: CT HEAD Brain: No acute intracranial hemorrhage. Gray-white differentiation is preserved. No hydrocephalus or extra-axial collection. No mass effect or midline shift. ASPECT score is 10. Vascular: No hyperdense vessel or unexpected calcification. Skull: No calvarial fracture or suspicious bone lesion. Skull base is unremarkable. Sinuses/Orbits: Unremarkable. CTA NECK Aortic arch: Three-vessel arch configuration. Atherosclerotic calcifications of the aortic arch and arch vessel origins. Arch vessel origins are patent. Right carotid system: Calcified plaque results in less than 50% stenosis of the proximal right cervical ICA. Left carotid system: Calcified plaque results in less than 50% stenosis of the proximal left cervical ICA. Vertebral arteries:Patent from the origin to the confluence with the basilar without stenosis or dissection. Skeleton: Mild cervical spondylosis without high-grade spinal canal stenosis.  Other neck: Emphysema in the lung apices.  Otherwise unremarkable. CTA HEAD Anterior circulation: Calcified plaque along the carotid siphons without hemodynamically significant stenosis. Abrupt cut off of the left MCA, mid M1 segment. The right MCA and bilateral ACAs are patent without stenosis or aneurysm. Distal branches are  symmetric. Posterior circulation: Normal basilar artery. The SCAs, AICAs and PICAs are patent proximally. The PCAs are patent proximally without stenosis or aneurysm. Distal branches are symmetric. Venous sinuses: Patent. Anatomic variants: None. IMPRESSION: 1. No acute intracranial hemorrhage. ASPECT score is 10. 2. Acute occlusion of the left MCA, mid M1 segment. Distal branches are symmetric. 3. No hemodynamically significant stenosis in the neck. Aortic Atherosclerosis (ICD10-I70.0) and Emphysema (ICD10-J43.9). Code stroke imaging results were communicated on 02/04/2023 at 9:28 pm to provider Dr. Otelia Limes via secure text paging. Electronically Signed   By: Orvan Falconer M.D.   On: 02/04/2023 21:34      HISTORY OF PRESENT ILLNESS ICY CHAMPIGNY is an 77 y.o. female with a PMHx of arthritis, anxiety, COPD, depression, HLD, HTN, prediabetes and tobacco abuse who presents to the ED from home via EMS as a Code Stroke after acute onset of right sided weakness and aphasia while at home with family this evening. LKN was 8:00 PM when she was watching TV with her family. Family left the room and when they came back she was on the floor and unable to speak. Family helped her back up to the couch and then called EMS. Per EMS she is not on any blood thinners at home.    Modified Rankin Scale: 0   HOSPITAL COURSE Teresa Ford is a 77 y.o. female with history of arthritis, anxiety, COPD, depression, HLD, HTN, prediabetes and tobacco abuse who presents to the ED from home via EMS as a Code Stroke after acute onset of right sided weakness and aphasia. LKN was 8:00 PM when she was watching TV with friends; they left the room and when they came back she was on the floor and unable to speak.    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 ASPECTS 10.    CTA head & neck  Acute occlusion of the left MCA, mid M1 segment  Cerebral angio Occluded left M1 segment s/p TICI3 Post IR CT no hemorrhage  MRI/MRA Patchy small volume acute left MCA distribution infarcts. No significant mass effect Probable scattered small volume acute subarachnoid hemorrhage about the left sylvian fissure Interval revascularization of previously seen left M1 occlusion. Left M1 segment now widely patent.  5/15 repeat CT Small focus of hemorrhage in the anterior left temporal lobe similar to the 02/05/2023 MRI. Additional trace SAH in the posterior left frontal sulci is also more apparent on the prior MRI. [place] groin left groin hematoma, no pseudoaneurysm.  2D Echo EF 60-65% [place] BLE no DVT LDL 66 HgbA1c 5.0 VTE prophylaxis - SCD's aspirin 81 mg daily prior to admission, now on Eliquis 5mg  BID.  Aspirin 81 mg has stopped Follow-up neurology appointment with [facility] in 4 weeks following discharge Therapy recommendations: No follow-up is needed Disposition:  pending    Afib w/RVR, now controlled with medication Frequency and duration of RVR increased overnight. Cardiology consult CHA2DS2/VAS Stroke Risk Points = 8  Cardizem gtt off.  Has been transitioned to 120 mg diltiazem daily New diagnosis, Captured  on tele and EKG Aspirin discontinued Eliquis started this morning given the small hemorrhagic conversion on CT Follow-up cardiology appointment set for 02/26/2023   Hypertension Home meds:  HCTZ 25 mg, losartan 50mg , on hold.  Not restarted since started on Cardizem p.o. Stable BP goal <180/105 Long-term BP goal normotensive   Hyperlipidemia Home meds:  Crestor 40mg , resumed in hospital LDL 66, goal < 70 On crestor 40 Continue statin at discharge   Tobacco Use  Encourage cessation  Nicotine patch in place Pt is wiling to quit   Other Stroke Risk Factors Advanced Age >/= 32    Other Active Problems COPD Anxiety/depression      DISCHARGE  EXAM Blood pressure (!) 130/91, pulse 74, temperature 97.9 F (36.6 C), temperature source Oral, resp. rate 18, weight 64.2 kg, last menstrual period 09/24/1974, SpO2 92 %.  General - Well nourished, well developed, in no apparent distress. Cardiovascular - Regular rhythm and rate.   Mental Status -  Level of arousal and orientation to time, place, and person were intact. Language including expression, naming, repetition, comprehension was assessed and found intact. Attention span and concentration were normal. Recent and remote memory were intact. Fund of Knowledge was assessed and was intact.   Cranial Nerves II - XII - II - Visual field intact OU. III, IV, VI - Extraocular movements intact. V - Facial sensation intact bilaterally. VII - slight right facial droop VIII - Hearing & vestibular intact bilaterally. X - Palate elevates symmetrically. XI - Chin turning & shoulder shrug intact bilaterally. XII - Tongue protrusion intact.   Motor Strength - The patient's strength was normal in all extremities and pronator drift was absent.  Bulk was normal and fasciculations were absent.   Motor Tone - Muscle tone was assessed at the neck and appendages and was normal.   Sensory - Light touch, temperature/pinprick were assessed and were symmetrical.     Coordination - The patient had normal movements in the hands and feet with no ataxia or dysmetria.  Tremor was absent.   Gait and Station - deferred.   Discharge Diet       Diet   Diet Heart Room service appropriate? Yes; Fluid consistency: Thin   liquids  DISCHARGE PLAN Disposition:  Home Eliquis (apixaban) daily for secondary stroke prevention Ongoing stroke risk factor control by Primary Care Physician at time of discharge Follow-up PCP Koirala, Dibas, MD in 2 weeks. Follow-up in [facility] in 8 weeks, office to schedule an appointment.   50 minutes were spent preparing discharge.  Gevena Mart DNP, ACNPC-AG  [facility]  ATTENDING NOTE: I reviewed above note and agree with the assessment and plan. Pt was seen and examined.   Daughter at the bedside, pt sitting in chair, dressed up, eager to go home. HR controlled, IV cardizem switched to po, eliquis started and ASA discontinued. Smoking cessation education again provided. Continue statin, follow up with cardiology and neurology as outpt. Pt medically stable for d/c home.   For detailed assessment and plan, please refer to above/below as I have made changes wherever appropriate.   Marvel Plan, MD PhD Stroke Neurology 02/08/2023 10:44 PM

## 2023-02-08 NOTE — Discharge Instructions (Signed)

## 2023-02-08 NOTE — Progress Notes (Signed)
ANTICOAGULATION CONSULT NOTE - Initial Consult  Pharmacy Consult:  Eliquis Indication: atrial fibrillation  Allergies  Allergen Reactions   Lamisil [Terbinafine] Rash    Patient Measurements: Weight: 64.2 kg (141 lb 8.6 oz)  Vital Signs: Temp: 97.9 F (36.6 C) (05/17 0805) Temp Source: Oral (05/17 0805) BP: 128/68 (05/17 0805) Pulse Rate: 74 (05/17 0805)  Labs: No results for input(s): "HGB", "HCT", "PLT", "APTT", "LABPROT", "INR", "HEPARINUNFRC", "HEPRLOWMOCWT", "CREATININE", "CKTOTAL", "CKMB", "TROPONINIHS" in the last 72 hours.  CrCl cannot be calculated (Unknown ideal weight.).   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  Patient has new-onset Afib RVR (CHADSVASC 8).  Repeat CT stable so Pharmacy consulted to dose Eliquis.  Patient qualifies for full dosing; CBC stable.  ASA discontinued.   Goal of Therapy:  Appropriate anticoagulation Monitor platelets by anticoagulation protocol: Yes   Plan:  Eliquis 5mg  PO BID Pharmacy will sign off.  Thank you for the consult!  Give Mag sulfate 3gm IV x 1  Marlowe Cinquemani D. Laney Potash, PharmD, BCPS, BCCCP 02/08/2023, 9:01 AM

## 2023-02-15 DIAGNOSIS — Z8673 Personal history of transient ischemic attack (TIA), and cerebral infarction without residual deficits: Secondary | ICD-10-CM | POA: Diagnosis not present

## 2023-02-15 DIAGNOSIS — D649 Anemia, unspecified: Secondary | ICD-10-CM | POA: Diagnosis not present

## 2023-02-15 DIAGNOSIS — R790 Abnormal level of blood mineral: Secondary | ICD-10-CM | POA: Diagnosis not present

## 2023-02-15 DIAGNOSIS — I4891 Unspecified atrial fibrillation: Secondary | ICD-10-CM | POA: Diagnosis not present

## 2023-02-15 DIAGNOSIS — Z09 Encounter for follow-up examination after completed treatment for conditions other than malignant neoplasm: Secondary | ICD-10-CM | POA: Diagnosis not present

## 2023-02-15 DIAGNOSIS — M549 Dorsalgia, unspecified: Secondary | ICD-10-CM | POA: Diagnosis not present

## 2023-02-26 ENCOUNTER — Ambulatory Visit (INDEPENDENT_AMBULATORY_CARE_PROVIDER_SITE_OTHER): Payer: Medicare Other | Admitting: Family

## 2023-02-26 ENCOUNTER — Encounter (HOSPITAL_BASED_OUTPATIENT_CLINIC_OR_DEPARTMENT_OTHER): Payer: Self-pay | Admitting: Family

## 2023-02-26 VITALS — BP 118/68 | HR 66 | Ht 62.5 in | Wt 136.0 lb

## 2023-02-26 DIAGNOSIS — I480 Paroxysmal atrial fibrillation: Secondary | ICD-10-CM | POA: Diagnosis not present

## 2023-02-26 DIAGNOSIS — Z720 Tobacco use: Secondary | ICD-10-CM

## 2023-02-26 DIAGNOSIS — I10 Essential (primary) hypertension: Secondary | ICD-10-CM

## 2023-02-26 DIAGNOSIS — D6859 Other primary thrombophilia: Secondary | ICD-10-CM

## 2023-02-26 MED ORDER — BUPROPION HCL ER (SR) 150 MG PO TB12
ORAL_TABLET | ORAL | 0 refills | Status: DC
Start: 2023-02-26 — End: 2023-08-02

## 2023-02-26 MED ORDER — DILTIAZEM HCL ER COATED BEADS 120 MG PO CP24
120.0000 mg | ORAL_CAPSULE | Freq: Every day | ORAL | 3 refills | Status: DC
Start: 2023-02-26 — End: 2023-08-02

## 2023-02-26 MED ORDER — APIXABAN 5 MG PO TABS
5.0000 mg | ORAL_TABLET | Freq: Two times a day (BID) | ORAL | 11 refills | Status: DC
Start: 2023-02-26 — End: 2023-06-01

## 2023-02-26 NOTE — Patient Instructions (Signed)
Medication Instructions:  Your physician has recommended you make the following change in your medication:   START Wellbutrin 150mg  once per day for three days then take 150mg  twice per day for 3 months This is to help with quitting smoking If you would like to do 6 months instead of 3 months just let us know   *If you need a refill on your cardiac medications before your next appointment, please call your pharmacy*  Testing/Procedures: Your EKG today showed normal sinus rhythm which is a good result!  Follow-Up: At [facility], you and your health needs are our priority.  As part of our continuing mission to provide you with exceptional heart care, we have created designated Provider Care Teams.  These Care Teams include your primary Cardiologist (physician) and Advanced Practice Providers (APPs -  Physician Assistants and Nurse Practitioners) who all work together to provide you with the care you need, when you need it.  We recommend signing up for the patient portal called "MyChart".  Sign up information is provided on this After Visit Summary.  MyChart is used to connect with patients for Virtual Visits (Telemedicine).  Patients are able to view lab/test results, encounter notes, upcoming appointments, etc.  Non-urgent messages can be sent to your provider as well.   To learn more about what you can do with MyChart, go to [email or web address].    Your next appointment:   3 month(s)  Provider:   Jodelle Red, MD or Gillian Shields, NP    Other Instructions  Atrial Fibrillation Atrial fibrillation (AFib) is a type of heartbeat that is irregular or fast. If you have AFib, your heart beats without any order. This makes it hard for your heart to pump blood in a normal way. AFib may come and go, or it may become a long-lasting problem. If AFib is not treated, it can put you at higher risk for stroke, heart failure, and other heart problems. What are the causes? AFib  may be caused by diseases that damage the heart's electrical system. They include: High blood pressure. Heart failure. Heart valve diseases. Heart surgery. Diabetes. Thyroid disease. Kidney disease. Lung diseases, such as pneumonia or COPD. Sleep apnea. Sometimes the cause is not known. What increases the risk? You are more likely to develop AFib if: You are older. You exercise often and very hard. You have a family history of AFib. You are female. You are Caucasian. You are overweight. You smoke. You drink a lot of alcohol. What are the signs or symptoms? Common symptoms of this condition include: A feeling that your heart is beating very fast. Chest pain or discomfort. Feeling short of breath. Suddenly feeling light-headed or weak. Getting tired easily during activity. Fainting. Sweating. In some cases, there are no symptoms. How is this treated? Medicines to: Prevent blood clots. Treat heart rate or heart rhythm problems. Using devices, such as a pacemaker, to correct heart rhythm problems. Doing surgery to remove the part of the heart that sends bad signals. Closing an area where clots can form in the heart (left atrial appendage). In some cases, your doctor will treat other underlying conditions. Follow these instructions at home: Medicines Take over-the-counter and prescription medicines only as told by your doctor. Do not take any new medicines without first talking to your doctor. If you are taking blood thinners: Talk with your doctor before taking aspirin or NSAIDs, such as ibuprofen. Take your medicines as told. Take them at the same time each day. Do  not do things that could hurt or bruise you. Be careful to avoid falls. Wear an alert bracelet or carry a card that says you take blood thinners. Lifestyle Do not smoke or use any products that contain nicotine or tobacco. If you need help quitting, ask your doctor. Eat heart-healthy foods. Talk with your  doctor about the right eating plan for you. Exercise regularly as told by your doctor. Do not drink alcohol. Lose weight if you are overweight. General instructions If you have sleep apnea, treat it as told by your doctor. Do not use diet pills unless your doctor says they are safe for you. Diet pills may make heart problems worse. Keep all follow-up visits. Your doctor will check your heart rate and rhythm regularly. Contact a doctor if: You notice a change in the speed, rhythm, or strength of your heartbeat. You are taking a blood-thinning medicine and you get more bruising. You get tired more easily when you move or exercise. You have a sudden change in weight. Get help right away if:  You have pain in your chest. You have trouble breathing. You have side effects of blood thinners, such as blood in your vomit, poop (stool), or pee (urine), or bleeding that cannot stop. You have any signs of a stroke. "BE FAST" is an easy way to remember the main warning signs: B - Balance. Dizziness, sudden trouble walking, or loss of balance. E - Eyes. Trouble seeing or a change in how you see. F - Face. Sudden weakness or loss of feeling in the face. The face or eyelid may droop on one side. A - Arms.Weakness or loss of feeling in an arm. This happens suddenly and usually on one side of the body. S - Speech. Sudden trouble speaking, slurred speech, or trouble understanding what people say. T - Time.Time to call emergency services. Write down what time symptoms started. You have other signs of a stroke, such as: A sudden, very bad headache with no known cause. Feeling like you may vomit (nausea). Vomiting. A seizure. These symptoms may be an emergency. Get help right away. Call 911. Do not wait to see if the symptoms will go away. Do not drive yourself to the hospital. This information is not intended to replace advice given to you by your health care provider. Make sure you discuss any questions  you have with your health care provider. Document Revised: 05/30/2022 Document Reviewed: 05/30/2022 Elsevier Patient Education  2024 [employer].

## 2023-02-26 NOTE — Progress Notes (Signed)
Office Visit    Patient Name: Teresa Ford Date of Encounter: 02/26/2023  PCP:  Darrow Bussing, MD   [facility]  Cardiologist:  Jodelle Red, MD  Advanced Practice Provider:  No care team member to display Electrophysiologist:  None      Chief Complaint    Teresa Ford is a 77 y.o. female presents today for hospital follow up    Past Medical History    Past Medical History:  Diagnosis Date   Anxiety    Arthritis    COPD (chronic obstructive pulmonary disease) (HCC)    Depression    Hyperlipidemia    Hypertension    Mixed incontinence 2010   Osteopenia    Overweight    Prediabetes    Renal cyst    left   Tobacco abuse    Past Surgical History:  Procedure Laterality Date   BREAST EXCISIONAL BIOPSY Right 1972   benign   BREAST SURGERY  1972   benign tumor removed rt breast   BUNIONECTOMY  12/2002   IR CT HEAD LTD  02/04/2023   IR PERCUTANEOUS ART THROMBECTOMY/INFUSION INTRACRANIAL INC DIAG ANGIO  02/04/2023   RADIOLOGY WITH ANESTHESIA N/A 02/04/2023   Procedure: RADIOLOGY WITH ANESTHESIA;  Surgeon: Radiologist, Medication, MD;  Location: [facility] OR;  Service: Radiology;  Laterality: N/A;   VAGINAL HYSTERECTOMY  1976    Allergies  Allergies  Allergen Reactions   Lamisil [Terbinafine] Rash    History of Present Illness    Teresa Ford is a 77 y.o. female with a hx of CVA, atrial fibrillation, hyperlipidemia, coronary calcification on CT, diabetes, hypertension, COPD, GAD, depression last seen while hospitalized.   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  Presumed due to new onset atrial fibrillation with RVR.  Echocardiogram LVEF 60 to 65%.  Bilateral lower extremity with no DVT.  Discharged on Eliquis 5 mg BID, Diltiazem 120mg  QD.  Losartan and hydrochlorothiazide were held to prevent hypotension as diltiazem initiated.  Presents today for follow  up with a friend. Feeling overall well since hospital discharge and has been increasing activity. Hopeful to be able to get back to working In her yard - this has been more difficult since her back started acting up 2 years ago. Enjoys reading in her spare time. PCP did labs 02/15/23 with no anemia. Tolerating her medications without issue.    EKGs/Labs/Other Studies Reviewed:   The following studies were reviewed today: Cardiac Studies & Procedures     STRESS TESTS  MYOCARDIAL PERFUSION IMAGING 10/25/2015  Narrative  Nuclear stress EF: 77%. Apical septal akinesis  There was no ST segment deviation noted during stress.  Defect 1: There is a medium defect of severe severity present in the apical anterior and apex location.  Findings consistent with prior myocardial infarction.  This is a low risk study.   ECHOCARDIOGRAM  ECHOCARDIOGRAM COMPLETE 02/05/2023  Narrative ECHOCARDIOGRAM REPORT    Patient Name:   Teresa Ford Date of Exam: 02/05/2023 Medical Rec #:  [id number]          Height:       62.5 in Accession #:    [id number]         Weight:       141.5 lb Date of Birth:  1946/02/11          BSA:          1.660 m  Patient Age:    77 years           BP:           122/84 mmHg Patient Gender: F                  HR:           66 bpm. Exam Location:  Inpatient  Procedure: 2D Echo, Cardiac Doppler and Color Doppler  Indications:    Stroke I63.9  History:        Patient has no prior history of Echocardiogram examinations. Stroke; Risk Factors:Dyslipidemia and Current Smoker.  Sonographer:    Lucendia Herrlich Referring Phys: [phone] ERIC LINDZEN  IMPRESSIONS   1. Left ventricular ejection fraction, by estimation, is 60 to 65%. The left ventricle has normal function. The left ventricle has no regional wall motion abnormalities. Left ventricular diastolic parameters are indeterminate. 2. Right ventricular systolic function is normal. The right ventricular size is normal. 3.  The mitral valve is degenerative. Mild mitral valve regurgitation. No evidence of mitral stenosis. 4. The aortic valve is normal in structure. Aortic valve regurgitation is not visualized. No aortic stenosis is present. 5. The inferior vena cava is normal in size with greater than 50% respiratory variability, suggesting right atrial pressure of 3 mmHg.  FINDINGS Left Ventricle: Left ventricular ejection fraction, by estimation, is 60 to 65%. The left ventricle has normal function. The left ventricle has no regional wall motion abnormalities. The left ventricular internal cavity size was normal in size. There is no left ventricular hypertrophy. Left ventricular diastolic parameters are indeterminate.  Right Ventricle: The right ventricular size is normal. No increase in right ventricular wall thickness. Right ventricular systolic function is normal.  Left Atrium: Left atrial size was normal in size.  Right Atrium: Right atrial size was normal in size.  Pericardium: There is no evidence of pericardial effusion. Presence of epicardial fat layer.  Mitral Valve: The mitral valve is degenerative in appearance. Mild to moderate mitral annular calcification. Mild mitral valve regurgitation. No evidence of mitral valve stenosis.  Tricuspid Valve: The tricuspid valve is normal in structure. Tricuspid valve regurgitation is not demonstrated. No evidence of tricuspid stenosis.  Aortic Valve: The aortic valve is normal in structure. Aortic valve regurgitation is not visualized. No aortic stenosis is present. Aortic valve peak gradient measures 11.3 mmHg.  Pulmonic Valve: The pulmonic valve was normal in structure. Pulmonic valve regurgitation is not visualized. No evidence of pulmonic stenosis.  Aorta: The aortic root is normal in size and structure.  Venous: The inferior vena cava is normal in size with greater than 50% respiratory variability, suggesting right atrial pressure of 3 mmHg.  IAS/Shunts:  No atrial level shunt detected by color flow Doppler.   LEFT VENTRICLE PLAX 2D LVIDd:         4.20 cm   Diastology LVIDs:         2.80 cm   LV e' medial:    9.48 cm/s LV PW:         0.90 cm   LV E/e' medial:  11.9 LV IVS:        0.70 cm   LV e' lateral:   10.60 cm/s LVOT diam:     2.00 cm   LV E/e' lateral: 10.7 LV SV:         69 LV SV Index:   42 LVOT Area:     3.14 cm   RIGHT VENTRICLE  IVC RV S prime:     13.50 cm/s  IVC diam: 2.00 cm TAPSE (M-mode): 2.4 cm  LEFT ATRIUM           Index        RIGHT ATRIUM           Index LA diam:      3.70 cm 2.23 cm/m   RA Area:     15.45 cm LA Vol (A4C): 38.6 ml 23.22 ml/m  RA Volume:   37.90 ml  22.83 ml/m AORTIC VALVE AV Area (Vmax): 1.93 cm AV Vmax:        168.00 cm/s AV Peak Grad:   11.3 mmHg LVOT Vmax:      103.47 cm/s LVOT Vmean:     67.267 cm/s LVOT VTI:       0.219 m  AORTA Ao Asc diam: 2.67 cm  MITRAL VALVE                TRICUSPID VALVE MV Area (PHT): 5.13 cm     TR Peak grad:   30.5 mmHg MV Decel Time: 148 msec     TR Vmax:        276.00 cm/s MV E velocity: 113.00 cm/s MV A velocity: 73.30 cm/s   SHUNTS MV E/A ratio:  1.54         Systemic VTI:  0.22 m Systemic Diam: 2.00 cm  Kardie Tobb DO Electronically signed by Thomasene Ripple DO Signature Date/Time: 02/05/2023/1:23:43 PM    Final              EKG:  EKG is  ordered today.  The ekg ordered today demonstrates NSR 66 bpm with low voltage QRS and no acute ST/T wave changes.  Recent Labs: 02/04/2023: ALT 14 02/05/2023: BUN 23; Creatinine, Ser 0.91; Hemoglobin 11.9; Platelets 206; Potassium 4.0; Sodium 136 02/07/2023: Magnesium 1.5; TSH 1.350  Recent Lipid Panel    Component Value Date/Time   CHOL 126 02/05/2023 0618   TRIG 28 02/05/2023 0618   HDL 54 02/05/2023 0618   CHOLHDL 2.3 02/05/2023 0618   VLDL 6 02/05/2023 0618   LDLCALC 66 02/05/2023 0618    Risk Assessment/Calculations:   CHA2DS2-VASc Score = 8   This indicates a 10.8% annual  risk of stroke. The patient's score is based upon: CHF History: 0 HTN History: 1 Diabetes History: 1 Stroke History: 2 Vascular Disease History: 1 Age Score: 2 Gender Score: 1     Home Medications   Current Meds  Medication Sig   apixaban (ELIQUIS) 5 MG TABS tablet Take 1 tablet (5 mg total) by mouth 2 (two) times daily.   bimatoprost (LUMIGAN) 0.01 % SOLN Place 1 drop into the right eye at bedtime.   Calcium Carbonate-Vitamin D (CALCIUM 600/VITAMIN D PO) Take 2 tablets by mouth daily.   cycloSPORINE, PF, (CEQUA) 0.09 % SOLN Place 1 drop into both eyes 2 (two) times daily.   diltiazem (CARDIZEM CD) 120 MG 24 hr capsule Take 1 capsule (120 mg total) by mouth daily.   INCRUSE ELLIPTA 62.5 MCG/INH AEPB Inhale 1 puff into the lungs daily.   loratadine (CLARITIN) 10 MG tablet Take 10 mg by mouth daily.   Multiple Vitamins-Minerals (CENTRUM SILVER PO) Take 1 tablet by mouth daily.   nicotine (NICODERM CQ - DOSED IN MG/24 HOURS) 21 mg/24hr patch Place 1 patch (21 mg total) onto the skin daily.   rosuvastatin (CRESTOR) 40 MG tablet Take 40 mg by mouth daily.   VITAMIN D  PO Take 1 tablet by mouth daily.   vitamin E 180 MG (400 UNITS) capsule Take 400 Units by mouth daily.     Review of Systems      All other systems reviewed and are otherwise negative except as noted above.  Physical Exam    VS:  BP 118/68   Pulse 66   Ht 5' 2.5" (1.588 m)   Wt 136 lb (61.7 kg)   LMP 09/24/1974   BMI 24.48 kg/m  , BMI Body mass index is 24.48 kg/m.  Wt Readings from Last 3 Encounters:  02/26/23 136 lb (61.7 kg)  02/04/23 141 lb 8.6 oz (64.2 kg)  07/26/21 137 lb (62.1 kg)     GEN: Well nourished, well developed, in no acute distress. HEENT: normal. Neck: Supple, no JVD, carotid bruits, or masses. Cardiac: RRR, no murmurs, rubs, or gallops. No clubbing, cyanosis, edema.  Radials/PT 2+ and equal bilaterally.  Respiratory:  Respirations regular and unlabored, clear to auscultation  bilaterally. GI: Soft, nontender, nondistended. MS: No deformity or atrophy. Skin: Warm and dry, no rash. Neuro:  Strength and sensation are intact. Psych: Normal affect.  Assessment & Plan    PAF / Hypercoagulable state - Maintaining NSR by EKG today. Continue Diltiazem 120mg  QD, Eliquis 5mg  BID. Does not meet dose reduction criteria. Labs 02/15/23 with no anemia - denies bleeding complications. CHA2DS2-VASc Score = 8 [CHF History: 0, HTN History: 1, Diabetes History: 1, Stroke History: 2, Vascular Disease History: 1, Age Score: 2, Gender Score: 1].  Therefore, the patient's annual risk of stroke is 10.8 %.      Hx of CVA - Continue Rosuvastatin. No ASA due to [facility].   Tobacco use - Using nicotine patches and motivated to quit. Will Rx Wellbutrin for smoking cessation. Smoking cessation encouraged. Recommend utilization of [id number].        Disposition: Follow up in 3 month(s) with Jodelle Red, MD or APP.  Signed, Alver Sorrow, NP 02/26/2023, 2:31 PM [facility]

## 2023-03-03 ENCOUNTER — Encounter (HOSPITAL_BASED_OUTPATIENT_CLINIC_OR_DEPARTMENT_OTHER): Payer: Self-pay | Admitting: Family

## 2023-03-05 DIAGNOSIS — M4316 Spondylolisthesis, lumbar region: Secondary | ICD-10-CM | POA: Diagnosis not present

## 2023-04-17 ENCOUNTER — Encounter: Payer: Self-pay | Admitting: Neurology

## 2023-04-17 ENCOUNTER — Ambulatory Visit: Payer: Medicare Other | Admitting: Neurology

## 2023-04-17 VITALS — BP 150/80 | HR 53 | Ht 62.0 in | Wt 134.5 lb

## 2023-04-17 DIAGNOSIS — I4891 Unspecified atrial fibrillation: Secondary | ICD-10-CM | POA: Diagnosis not present

## 2023-04-17 DIAGNOSIS — E785 Hyperlipidemia, unspecified: Secondary | ICD-10-CM | POA: Diagnosis not present

## 2023-04-17 DIAGNOSIS — I10 Essential (primary) hypertension: Secondary | ICD-10-CM | POA: Diagnosis not present

## 2023-04-17 DIAGNOSIS — I6602 Occlusion and stenosis of left middle cerebral artery: Secondary | ICD-10-CM | POA: Diagnosis not present

## 2023-04-17 DIAGNOSIS — F17200 Nicotine dependence, unspecified, uncomplicated: Secondary | ICD-10-CM

## 2023-04-17 NOTE — Progress Notes (Signed)
Patient: Teresa Ford Date of Birth: 11-May-1946  Reason for Visit: Stroke clinic follow up History from: Patient, friend Primary Neurologist: Pearlean Brownie   ASSESSMENT AND PLAN 77 y.o. 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  Etiology likely due to new diagnosed A-fib.  Remains on Eliquis.  Vascular risk factors: HTN, HLD, tobacco use. Mild essential tremor noted in her hands. Mild facial droop on the right, mild gait abnormality likely from back issues.   -Doing overall well, remains on Eliquis due to A-fib -Continue close follow-up with primary care doctor for strict management of vascular risk factors with a goal BP less than 130/90, A1c less than 7.0, LDL less than 70 for secondary stroke prevention -Encouraged to continue work on smoking cessation -Follow-up at our office on an as-needed basis  HISTORY OF PRESENT ILLNESS: Today 04/17/23 Here with her friend and neighbor, [name]. She lives alone, her daughter has been staying with her since the stroke. Feels back to normal except right hand writing is shaky, doesn't necessarily feel week. Feels off balance, using a cane, but actually walking better since stroke. Having some chronic back issues, was planning to have lumbar fusion with Dr. Yetta Barre. She drives, doing okay. No falls. Speech is back to normal, mild right sided facial droop. Eating is fine. Memory is good. BP was up 161/96, BP was 128/73 at home. Is on Cardizem for AFIB with RVR, was taken off BP med in hosptial. Is on Crestor.  No therapy was needed. Will likely be moving to [facility] to be closer to her children. Stopped smoking, wearing patch had reaction, is vaping, down to 3 mg, just bought 0 mg vape. Friend thinks walking is actually better, still limps on the right.  Remains on Eliquis.  HISTORY Presented to the ER 02/04/2023 as code stroke with acute onset right-sided weakness and aphasia.  Found to have acute small  patchy left MCA infarct due to left M1 occlusion s/p TNK and IR with TICI 3 likely due to new diagnosed A-fib.  Started on Eliquis.  -Code stroke CT head no acute abnormality -CTA head and neck acute occlusion of the left MCA, mild M1 segment -Cerebral angio occluded left M1 segment s/p TICI 3 -Post IR CT head no hemorrhage -MRI/MRA: Patchy small volume acute left MCA distribution infarcts.  Probable scattered small volume acute subarachnoid hemorrhage about the left sylvian fissure.  Interval revascularization of previously seen left M1 occlusion.  Left M1 segment now widely patent. -5/15 repeat CT head small focus of hemorrhage in the anterior left temporal lobe similar to 5/14 MRI.  Additional trace SAH in posterior left frontal skull, is more apparent on the prior MRI -[place] groin left groin hematoma, no pseudoaneurysm -2D echo EF 60 to 65% -[place] BLE no DVT -LDL 66 -A1c 5.0 -Aspirin 81 mg daily prior to admission, then started on Eliquis 5 mg twice daily.  Aspirin stopped.  REVIEW OF SYSTEMS: Out of a complete 14 system review of symptoms, the patient complains only of the following symptoms, and all other reviewed systems are negative.  See HPI  ALLERGIES: Allergies  Allergen Reactions   Lamisil [Terbinafine] Rash    HOME MEDICATIONS: Outpatient Medications Prior to Visit  Medication Sig Dispense Refill   apixaban (ELIQUIS) 5 MG TABS tablet Take 1 tablet (5 mg total) by mouth 2 (two) times daily. 60 tablet 11   bimatoprost (LUMIGAN) 0.01 % SOLN Place  1 drop into the right eye at bedtime.     buPROPion (WELLBUTRIN SR) 150 MG 12 hr tablet Take 1 tablet (150 mg total) by mouth daily for 3 days, THEN 1 tablet (150 mg total) 2 (two) times daily. 243 tablet 0   Calcium Carbonate-Vitamin D (CALCIUM 600/VITAMIN D PO) Take 2 tablets by mouth daily.     cycloSPORINE, PF, (CEQUA) 0.09 % SOLN Place 1 drop into both eyes 2 (two) times daily.     diltiazem (CARDIZEM CD) 120 MG 24 hr capsule Take 1  capsule (120 mg total) by mouth daily. 90 capsule 3   INCRUSE ELLIPTA 62.5 MCG/INH AEPB Inhale 1 puff into the lungs daily.     loratadine (CLARITIN) 10 MG tablet Take 10 mg by mouth daily.     Multiple Vitamins-Minerals (CENTRUM SILVER PO) Take 1 tablet by mouth daily.     nicotine (NICODERM CQ - DOSED IN MG/24 HOURS) 21 mg/24hr patch Place 1 patch (21 mg total) onto the skin daily. 28 patch 0   rosuvastatin (CRESTOR) 40 MG tablet Take 40 mg by mouth daily.     VITAMIN D PO Take 1 tablet by mouth daily.     vitamin E 180 MG (400 UNITS) capsule Take 400 Units by mouth daily.     No facility-administered medications prior to visit.    PAST MEDICAL HISTORY: Past Medical History:  Diagnosis Date   Anxiety    Arthritis    COPD (chronic obstructive pulmonary disease) (HCC)    Depression    Hyperlipidemia    Hypertension    Mixed incontinence 2010   Osteopenia    Overweight    Prediabetes    Renal cyst    left   Tobacco abuse     PAST SURGICAL HISTORY: Past Surgical History:  Procedure Laterality Date   BREAST EXCISIONAL BIOPSY Right 1972   benign   BREAST SURGERY  1972   benign tumor removed rt breast   BUNIONECTOMY  12/2002   IR CT HEAD LTD  02/04/2023   IR PERCUTANEOUS ART THROMBECTOMY/INFUSION INTRACRANIAL INC DIAG ANGIO  02/04/2023   RADIOLOGY WITH ANESTHESIA N/A 02/04/2023   Procedure: RADIOLOGY WITH ANESTHESIA;  Surgeon: Radiologist, Medication, MD;  Location: [facility] OR;  Service: Radiology;  Laterality: N/A;   VAGINAL HYSTERECTOMY  1976    FAMILY HISTORY: Family History  Problem Relation Age of Onset   Heart disease Mother    Dementia Mother    Osteoporosis Mother    Diabetes Father    Heart attack Father    Cancer Sister        lung cancer   SOCIAL HISTORY: Social History   Socioeconomic History   Marital status: Widowed    Spouse name: Not on file   Number of children: 2   Years of education: Not on file   Highest education level: Not on file   Occupational History   Occupation: retired  Tobacco Use   Smoking status: Former    Current packs/day: 0.00    Average packs/day: 1 pack/day for 40.0 years (40.0 ttl pk-yrs)    Types: Cigarettes    Start date: 02/04/1983    Quit date: 02/04/2023    Years since quitting: 0.1   Smokeless tobacco: Never  Substance and Sexual Activity   Alcohol use: Yes    Alcohol/week: 3.0 - 4.0 standard drinks of alcohol    Types: 3 - 4 Glasses of wine per week   Drug use: No   Sexual activity:  Not Currently    Partners: Male    Birth control/protection: Surgical    Comment: TVH-1ST INTERCOURSE: 70, <5 partners, hysterectomy  Other Topics Concern   Not on file  Social History Narrative   Not on file   Social Determinants of Health   Financial Resource Strain: Not on file  Food Insecurity: Not on file  Transportation Needs: Not on file  Physical Activity: Not on file  Stress: Not on file  Social Connections: Not on file  Intimate Partner Violence: Not on file   PHYSICAL EXAM  Vitals:   04/17/23 0952 04/17/23 1032  BP: (!) 161/96 (!) 150/80  Pulse: (!) 53   Weight: 134 lb 8 oz (61 kg)   Height: 5\' 2"  (1.575 m)    Body mass index is 24.6 kg/m.  Generalized: Well developed, in no acute distress  Neurological examination  Mentation: Alert oriented to time, place, history taking. Follows all commands speech and language fluent Cranial nerve II-XII: Pupils were equal round reactive to light. Extraocular movements were full, visual field were full on confrontational test.  Slight right-sided facial droop to mouth noted.  Head turning and shoulder shrug were normal and symmetric. Motor: The motor testing reveals 5 over 5 strength of all 4 extremities. Good symmetric motor tone is noted throughout.  Sensory: Sensory testing is intact to soft touch on all 4 extremities. No evidence of extinction is noted.  Coordination: Cerebellar testing reveals good finger-nose-finger and heel-to-shin  bilaterally.  Fine tremor noted bilaterally to hands.  Mild tremor translated to spiral drawl and handwriting sample Gait and station: Gait is wide-based, slight limp on the right, can walk independently, uses single-point cane in the hallway. Reflexes: Deep tendon reflexes are symmetric and normal bilaterally.   DIAGNOSTIC DATA (LABS, IMAGING, TESTING) - I reviewed patient records, labs, notes, testing and imaging myself where available.  Lab Results  Component Value Date   WBC 8.4 02/05/2023   HGB 11.9 (L) 02/05/2023   HCT 34.3 (L) 02/05/2023   MCV 100.3 (H) 02/05/2023   PLT 206 02/05/2023      Component Value Date/Time   NA 136 02/05/2023 0618   K 4.0 02/05/2023 0618   CL 105 02/05/2023 0618   CO2 23 02/05/2023 0618   GLUCOSE 229 (H) 02/05/2023 0618   BUN 23 02/05/2023 0618   CREATININE 0.91 02/05/2023 0618   CALCIUM 8.6 (L) 02/05/2023 0618   PROT 4.8 (L) 02/04/2023 2054   ALBUMIN 2.8 (L) 02/04/2023 2054   AST 24 02/04/2023 2054   ALT 14 02/04/2023 2054   ALKPHOS 43 02/04/2023 2054   BILITOT 0.4 02/04/2023 2054   GFRNONAA >60 02/05/2023 0618   Lab Results  Component Value Date   CHOL 126 02/05/2023   HDL 54 02/05/2023   LDLCALC 66 02/05/2023   TRIG 28 02/05/2023   CHOLHDL 2.3 02/05/2023   Lab Results  Component Value Date   HGBA1C 5.0 02/05/2023   No results found for: "VITAMINB12" Lab Results  Component Value Date   TSH 1.350 02/07/2023    Margie Ege, AGNP-C, DNP 04/17/2023, 10:38 AM [facility] [address] [place], [place] [address] [phone]

## 2023-04-17 NOTE — Patient Instructions (Signed)
Remain on Eliquis   Strict management of vascular risk factors with a goal BP less than 130/90, A1c less than 7.0, LDL less than 70 for secondary stroke prevention  Keep working on smoking cessation   Continue follow up with your primary care doctor  Return here as needed

## 2023-04-20 NOTE — Progress Notes (Signed)
I agree with the above plan 

## 2023-05-07 ENCOUNTER — Other Ambulatory Visit (HOSPITAL_COMMUNITY): Payer: Self-pay | Admitting: Neurological Surgery

## 2023-05-07 DIAGNOSIS — M4316 Spondylolisthesis, lumbar region: Secondary | ICD-10-CM

## 2023-05-08 ENCOUNTER — Ambulatory Visit (HOSPITAL_COMMUNITY)
Admission: RE | Admit: 2023-05-08 | Discharge: 2023-05-08 | Disposition: A | Discharge status: Home / Self Care | Payer: Medicare Other | Source: Ambulatory Visit | Attending: Neurological Surgery | Admitting: Neurological Surgery

## 2023-05-08 DIAGNOSIS — M4319 Spondylolisthesis, multiple sites in spine: Secondary | ICD-10-CM | POA: Diagnosis not present

## 2023-05-08 DIAGNOSIS — M47816 Spondylosis without myelopathy or radiculopathy, lumbar region: Secondary | ICD-10-CM | POA: Diagnosis not present

## 2023-05-08 DIAGNOSIS — M4316 Spondylolisthesis, lumbar region: Secondary | ICD-10-CM | POA: Diagnosis not present

## 2023-05-08 DIAGNOSIS — M5136 Other intervertebral disc degeneration, lumbar region: Secondary | ICD-10-CM | POA: Diagnosis not present

## 2023-05-08 DIAGNOSIS — M48061 Spinal stenosis, lumbar region without neurogenic claudication: Secondary | ICD-10-CM | POA: Diagnosis not present

## 2023-05-14 ENCOUNTER — Other Ambulatory Visit: Payer: Self-pay | Admitting: Neurological Surgery

## 2023-05-14 ENCOUNTER — Other Ambulatory Visit: Payer: Self-pay

## 2023-05-14 NOTE — Patient Outreach (Signed)
Telephone outreach to patient to obtain mRS was successfully completed. MRS= 0  Teresa Ford [facility] Care Management Assistant [phone]  

## 2023-05-15 ENCOUNTER — Telehealth: Payer: Self-pay | Admitting: *Deleted

## 2023-05-15 NOTE — Telephone Encounter (Signed)
   Pre-operative Risk Assessment    Patient Name: Teresa Ford  DOB: 1946-07-23 MRN: [id number]    DATE OF LAST VISIT: 02/26/23 Gillian Shields, NP DATE OF NEXT VISIT: 06/05/23 DR. BRIDGETTE CHRISTOPHER  Request for Surgical Clearance    Procedure:   L4-5 LUMBAR FUSION  Date of Surgery:  Clearance 05/31/23                                 Surgeon:  DR. Marikay Alar Surgeon's Group or Practice Name:   [facility] Phone number:  [phone] Fax number:  [phone] ATTN: Erie Noe EXT [id number]   Type of Clearance Requested:   - Medical  - Pharmacy:  Hold Apixaban (Eliquis)     Type of Anesthesia:  General    Additional requests/questions:    Elpidio Anis   05/15/2023, 10:25 AM

## 2023-05-15 NOTE — Telephone Encounter (Addendum)
Patient with diagnosis of afib on Eliquis for anticoagulation.    Procedure: L4-5 Lumbar Fusion Date of procedure: 05/31/23  CHA2DS2-VASc Score = 7  This indicates a 11.2% annual risk of stroke. The patient's score is based upon: CHF History: 0 HTN History: 1 Diabetes History: 0 Stroke History: 2 Vascular Disease History: 1 Age Score: 2 Gender Score: 1   CrCl 50 ml/min Platelet count 206  Spinal injections typically require 3 day Eliquis hold. However, pt was diagnosed with afib and had a stroke 3 months ago. Will forward to MD to provide input on anticoag hold. However, pt has only seen APP since discharge and is not scheduled to see Dr. Cristal Deer until a week after her scheduled procedure date.  **This guidance is not considered finalized until pre-operative APP has relayed final recommendations.**

## 2023-05-15 NOTE — Telephone Encounter (Signed)
Pharmacy please advise on holding Eliquis prior to L4-L5 lumbar fusion scheduled for 05/31/2023. Thank you.

## 2023-05-16 DIAGNOSIS — Z6825 Body mass index (BMI) 25.0-25.9, adult: Secondary | ICD-10-CM | POA: Diagnosis not present

## 2023-05-16 DIAGNOSIS — M4316 Spondylolisthesis, lumbar region: Secondary | ICD-10-CM | POA: Diagnosis not present

## 2023-05-20 DIAGNOSIS — H401111 Primary open-angle glaucoma, right eye, mild stage: Secondary | ICD-10-CM | POA: Diagnosis not present

## 2023-05-21 NOTE — Telephone Encounter (Signed)
Dr. Cristal Deer,   I am not sure if you saw the request from Pharm D for your recommendation to hold Eliquis 3 days prior to L4-L5 Lumbar fusion scheduled for 05/31/2023. Patient has a history of afib and had a stroke May 2024 s/p thrombolysis.   Please route your response to P CV DIV Preop. I will communicate with requesting office once you have given recommendations.   Thank you!  Carlos Levering, NP

## 2023-05-22 NOTE — Pre-Procedure Instructions (Signed)
Surgical Instructions    Your procedure is scheduled on Friday, September 6th.  Report to [facility] Main Entrance "A" at 5:30 A.M., then check in with the Admitting office.  Call this number if you have problems the morning of surgery:  [phone]  If you have any questions prior to your surgery date call [phone]: Open Monday-Friday 8am-4pm If you experience any cold or flu symptoms such as cough, fever, chills, shortness of breath, etc. between now and your scheduled surgery, please notify us at the above number.     Remember:  Do not eat or drink after midnight the night before your surgery    Take these medicines the morning of surgery with A SIP OF WATER  buPROPion (WELLBUTRIN SR)  diltiazem (CARDIZEM CD)  rosuvastatin (CRESTOR)     Take these medications as needed: loratadine (CLARITIN)   Follow your surgeon's instructions on when to stop apixaban (ELIQUIS).  If no instructions were given by your surgeon then you will need to call the office to get those instructions.    As of today, STOP taking any Aspirin (unless otherwise instructed by your surgeon) Aleve, Naproxen, Ibuprofen, Motrin, Advil, Goody's, BC's, all herbal medications, fish oil, and all vitamins.                     Do NOT Smoke (Tobacco/Vaping) for 24 hours prior to your procedure.  If you use a CPAP at night, you may bring your mask/headgear for your overnight stay.   Contacts, glasses, piercing's, hearing aid's, dentures or partials may not be worn into surgery, please bring cases for these belongings.    For patients admitted to the hospital, discharge time will be determined by your treatment team.   Patients discharged the day of surgery will not be allowed to drive home, and someone needs to stay with them for 24 hours.  SURGICAL WAITING ROOM VISITATION Patients having surgery or a procedure may have no more than 2 support people in the waiting area - these visitors may rotate.   Children  under the age of 3 must have an adult with them who is not the patient. If the patient needs to stay at the hospital during part of their recovery, the visitor guidelines for inpatient rooms apply. Pre-op nurse will coordinate an appropriate time for 1 support person to accompany patient in pre-op.  This support person may not rotate.   Please refer to the [facility] website for the visitor guidelines for Inpatients (after your surgery is over and you are in a regular room).    Special instructions:    Pre-operative 5 CHG Bath Instructions   You can play a key role in reducing the risk of infection after surgery. Your skin needs to be as free of germs as possible. You can reduce the number of germs on your skin by washing with CHG (chlorhexidine gluconate) soap before surgery. CHG is an antiseptic soap that kills germs and continues to kill germs even after washing.   DO NOT use if you have an allergy to chlorhexidine/CHG or antibacterial soaps. If your skin becomes reddened or irritated, stop using the CHG and notify one of our RNs at [phone].   Please shower with the CHG soap starting 4 days before surgery using the following schedule:     Please keep in mind the following:  DO NOT shave, including legs and underarms, starting the day of your first shower.   You may shave your face at  any point before/day of surgery.  Place clean sheets on your bed the day you start using CHG soap. Use a clean washcloth (not used since being washed) for each shower. DO NOT sleep with pets once you start using the CHG.   CHG Shower Instructions:  If you choose to wash your hair and private area, wash first with your normal shampoo/soap.  After you use shampoo/soap, rinse your hair and body thoroughly to remove shampoo/soap residue.  Turn the water OFF and apply about 3 tablespoons (45 ml) of CHG soap to a CLEAN washcloth.  Apply CHG soap ONLY FROM YOUR NECK DOWN TO YOUR TOES (washing for 3-5  minutes)  DO NOT use CHG soap on face, private areas, open wounds, or sores.  Pay special attention to the area where your surgery is being performed.  If you are having back surgery, having someone wash your back for you may be helpful. Wait 2 minutes after CHG soap is applied, then you may rinse off the CHG soap.  Pat dry with a clean towel  Put on clean clothes/pajamas   If you choose to wear lotion, please use ONLY the CHG-compatible lotions on the back of this paper.     Additional instructions for the day of surgery: DO NOT APPLY any makeup, lotions, deodorants, cologne, or perfumes.   Put on clean/comfortable clothes.  Brush your teeth.  Ask your nurse before applying any prescription medications to the skin. Do not wear nail polish, gel polish, artificial nails, or any other type of covering on natural nails (fingers and toes). Do not bring valuables to the hospital. [facility] is not responsible for any belongings or valuables.      CHG Compatible Lotions   Aveeno Moisturizing lotion  Cetaphil Moisturizing Cream  Cetaphil Moisturizing Lotion  Clairol Herbal Essence Moisturizing Lotion, Dry Skin  Clairol Herbal Essence Moisturizing Lotion, Extra Dry Skin  Clairol Herbal Essence Moisturizing Lotion, Normal Skin  Curel Age Defying Therapeutic Moisturizing Lotion with Alpha Hydroxy  Curel Extreme Care Body Lotion  Curel Soothing Hands Moisturizing Hand Lotion  Curel Therapeutic Moisturizing Cream, Fragrance-Free  Curel Therapeutic Moisturizing Lotion, Fragrance-Free  Curel Therapeutic Moisturizing Lotion, Original Formula  Eucerin Daily Replenishing Lotion  Eucerin Dry Skin Therapy Plus Alpha Hydroxy Crme  Eucerin Dry Skin Therapy Plus Alpha Hydroxy Lotion  Eucerin Original Crme  Eucerin Original Lotion  Eucerin Plus Crme Eucerin Plus Lotion  Eucerin TriLipid Replenishing Lotion  Keri Anti-Bacterial Hand Lotion  Keri Deep Conditioning Original Lotion Dry Skin  Formula Softly Scented  Keri Deep Conditioning Original Lotion, Fragrance Free Sensitive Skin Formula  Keri Lotion Fast Absorbing Fragrance Free Sensitive Skin Formula  Keri Lotion Fast Absorbing Softly Scented Dry Skin Formula  Keri Original Lotion  Keri Skin Renewal Lotion Keri Silky Smooth Lotion  Keri Silky Smooth Sensitive Skin Lotion  Nivea Body Creamy Conditioning Oil  Nivea Body Extra Enriched Lotion  Nivea Body Original Lotion  Nivea Body Sheer Moisturizing Lotion Nivea Crme  Nivea Skin Firming Lotion  NutraDerm 30 Skin Lotion  NutraDerm Skin Lotion  NutraDerm Therapeutic Skin Cream  NutraDerm Therapeutic Skin Lotion  ProShield Protective Hand Cream  Provon moisturizing lotion    Please read over the following fact sheets that you were given.    If you received a COVID test during your pre-op visit  it is requested that you wear a mask when out in public, stay away from anyone that may not be feeling well and notify your surgeon if  you develop symptoms. If you have been in contact with anyone that has tested positive in the last 10 days please notify you surgeon.

## 2023-05-23 ENCOUNTER — Other Ambulatory Visit: Payer: Self-pay

## 2023-05-23 ENCOUNTER — Encounter (HOSPITAL_COMMUNITY): Payer: Self-pay

## 2023-05-23 ENCOUNTER — Encounter (HOSPITAL_COMMUNITY)
Admission: RE | Admit: 2023-05-23 | Discharge: 2023-05-23 | Disposition: A | Discharge status: Home / Self Care | Payer: Medicare Other | Source: Ambulatory Visit | Attending: Neurological Surgery | Admitting: Neurological Surgery

## 2023-05-23 VITALS — BP 154/94 | HR 74 | Temp 98.1°F | Resp 17 | Ht 62.0 in | Wt 134.4 lb

## 2023-05-23 DIAGNOSIS — I10 Essential (primary) hypertension: Secondary | ICD-10-CM | POA: Insufficient documentation

## 2023-05-23 DIAGNOSIS — Z01812 Encounter for preprocedural laboratory examination: Secondary | ICD-10-CM | POA: Insufficient documentation

## 2023-05-23 DIAGNOSIS — Z01818 Encounter for other preprocedural examination: Secondary | ICD-10-CM | POA: Diagnosis present

## 2023-05-23 DIAGNOSIS — Z9189 Other specified personal risk factors, not elsewhere classified: Secondary | ICD-10-CM | POA: Insufficient documentation

## 2023-05-23 HISTORY — DX: Unspecified atrial fibrillation: I48.91

## 2023-05-23 HISTORY — DX: Cerebral infarction, unspecified: I63.9

## 2023-05-23 LAB — CBC
HCT: 41.7 % (ref 36.0–46.0)
Hemoglobin: 14.4 g/dL (ref 12.0–15.0)
MCH: 34 pg (ref 26.0–34.0)
MCHC: 34.5 g/dL (ref 30.0–36.0)
MCV: 98.3 fL (ref 80.0–100.0)
Platelets: 225 10*3/uL (ref 150–400)
RBC: 4.24 MIL/uL (ref 3.87–5.11)
RDW: 11.8 % (ref 11.5–15.5)
WBC: 6.3 10*3/uL (ref 4.0–10.5)
nRBC: 0 % (ref 0.0–0.2)

## 2023-05-23 LAB — TYPE AND SCREEN
ABO/RH(D): A POS
Antibody Screen: NEGATIVE

## 2023-05-23 LAB — BASIC METABOLIC PANEL
Anion gap: 9 (ref 5–15)
BUN: 15 mg/dL (ref 8–23)
CO2: 27 mmol/L (ref 22–32)
Calcium: 10.1 mg/dL (ref 8.9–10.3)
Chloride: 101 mmol/L (ref 98–111)
Creatinine, Ser: 1.02 mg/dL — ABNORMAL HIGH (ref 0.44–1.00)
GFR, Estimated: 57 mL/min — ABNORMAL LOW (ref >60)
Glucose, Bld: 110 mg/dL — ABNORMAL HIGH (ref 70–99)
Potassium: 3.9 mmol/L (ref 3.5–5.1)
Sodium: 137 mmol/L (ref 135–145)

## 2023-05-23 LAB — PROTIME-INR
INR: 1.2 (ref 0.8–1.2)
Prothrombin Time: 15.7 seconds — ABNORMAL HIGH (ref 11.4–15.2)

## 2023-05-23 LAB — SURGICAL PCR SCREEN
MRSA, PCR: NEGATIVE
Staphylococcus aureus: NEGATIVE

## 2023-05-23 NOTE — Progress Notes (Signed)
PCP - [place], [place] Cardiologist - Christoher Bridgette  PPM/ICD -  Denies Device Orders - n/a Rep Notified - n/a  Chest x-ray -  EKG - 02-26-23 Stress Test - 2014 ECHO - 02-05-23 Cardiac Cath -   Sleep Study - Denies CPAP - n/a  DM- Denies  Blood Thinner Instructions: Eliquis stop 3 days prior to surgery last dose 05-27-23. (Per Cardiology) Aspirin Instructions: n/a  ERAS Protcol -NPO  COVID TEST- n/a   Anesthesia review: yes HTN recent stroke May 2024  Patient denies shortness of breath, fever, cough and chest pain at PAT appointment   All instructions explained to the patient, with a verbal understanding of the material. Patient agrees to go over the instructions while at home for a better understanding. Patient also instructed to self quarantine after being tested for COVID-19. The opportunity to ask questions was provided.

## 2023-05-24 NOTE — Telephone Encounter (Signed)
I will forward to pre op APP and Dr. Cristal Deer as requesting office is calling checking in if pt has been cleared.

## 2023-05-24 NOTE — Telephone Encounter (Signed)
Caller Erie Noe) is following up on the status of patient's clearance.  Caller noted patient's surgery is scheduled for next Friday, 9/6.

## 2023-05-24 NOTE — Progress Notes (Signed)
Anesthesia Chart Review:  Case: [id number] Date/Time: 05/31/23 0715   Procedure: PLIF - L4-L5 - L5-S1 - Posterior Lateral and Interbody fusion (Back)   Anesthesia type: General   Pre-op diagnosis: Spondylolisthesis   [facility]: [facility] ROOM 19 / [facility]   Surgeons: Arman Bogus, MD       DISCUSSION: Patient is a 77 year old female scheduled for the above procedure.  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   Hospitalized 02/04/23 - 02/08/23 for acute stroke. Found by family on the floor, aphasic with right hemiplegia. Code Stroke initiated. CT head without acute hemorrhage. CTA of the head/neck showed acute occlusions of left MCA. S/p TNK thrombolysis by Neuro IR with improvement of right sided weakness and speech with minimal right facial droop.  02/05/23 echo showed LVEF 60-65%, no regional wall motion abnormalities, normal RV systolic function, mild MR. EKG on 02/06/23 showed new afib with RVR, likely contributing to her CVA. Cardiologist Dr. Cristal Deer was consulted. Afib rate controlled on diltiazem, and later converted to SR. Ultimately started on Eliquis (and ASA discontinued) after she had serial brain images for stability for follow-up left MCA distribution infarcts with probably scattered small volume SAH about the left sylvian fissure and small focus hemorrhage in the anterior left temporal lobe.   She was maintaining SR at her 02/26/23 cardiology follow-up with Gillian Shields, NP. Continue Crestor and Eliquis. Smoking cessation encouraged. Follow-up in 3 months. Surgeon has reached out to cardiology for permission to hold Eliquis and waiting approval. Patient indicated she was instructed to hold for 3 days.  Given CVA just over 3 months ago would recommend communication with neurology as well. I notified Erie Noe at Dr. Yetta Barre' office. Discussed with  anesthesiologist Achille Rich, MD.   Rica Mote 05/30/23 1:36 PM: Additional preoperative input received from neurology and cardiology.   On 05/28/23, neurologist Dr. Delia Heady classified patient as moderate risk and added, "Hold [Eliquis] 3 days before surgery and restart after surgery when safe with a small but acceptable risk of stroke/TIA if patient is willing".  On 05/30/23, patient had a telephonic preoperative cardiology evaluation by Joni Reining, NP who wrote, "Preoperative Cardiovascular Risk Assessment:   According to the Revised Cardiac Risk Index (RCRI), her Perioperative Risk of Major Cardiac Event is (%): 0.4   Her Functional Capacity in METs is: 4.37 according to the Duke Activity Status Index (DASI).      CHA2DS2-VASc Score = 7 ...  Per Dr. Cristal Deer 05/27/2023 Chads Vasc risk is 8. she is high risk for holding anticoagulation given her CV score and prior stroke in May, but it is also a high bleeding risk procedure. She needs to understand the risk for holding her anticoagulation and risk of potential stroke. will also need to know how long surgeon plans to hold post procedure. Overall if she understands and accepts the risk then ok to hold as pharmd directed. otherwise would recommend bridging, though this is not something we typically do with DOAC."    Therefore, based on ACC/AHA guidelines, patient would be at acceptable risk for the planned procedure without further cardiovascular testing. I will route this recommendation to the requesting party via Epic fax function."    VS: BP (!) 154/94   Pulse 74   Temp 36.7 C   Resp 17   Ht 5\' 2"  (1.575 m)   Wt 61 kg   LMP 09/24/1974  SpO2 97%   BMI 24.58 kg/m    PROVIDERS: Koirala, Dibas, MD is PCP  Jodelle Red, MD is cardiologist  Delia Heady, MD is neurologist   LABS: Labs reviewed: Acceptable for surgery. A1c 5.0% 02/05/23.  (all labs ordered are listed, but only abnormal results are  displayed)  Labs Reviewed  BASIC METABOLIC PANEL - Abnormal; Notable for the following components:      Result Value   Glucose, Bld 110 (*)    Creatinine, Ser 1.02 (*)    GFR, Estimated 57 (*)    All other components within normal limits  PROTIME-INR - Abnormal; Notable for the following components:   Prothrombin Time 15.7 (*)    All other components within normal limits  SURGICAL PCR SCREEN  CBC  TYPE AND SCREEN    IMAGES: CT L-spine 05/08/23: IMPRESSION: 1. Chronic bilateral pars defects at L5. Spondylolisthesis of 4 mm. Bulging of the disc. Mild stenosis of the canal. Foraminal narrowing left worse than right. Neural compression possible at this level. 2. Chronic facet arthropathy at L4-5 with anterolisthesis of 11 mm. Severe stenosis of the canal and neural foramina at this level could cause neural compression on either or both sides.    MRI/MRA Head 02/05/23 (post CVA, thrombolysis): MRI HEAD IMPRESSION: 1. 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 No significant mass effect. 2. Probable scattered small volume acute subarachnoid hemorrhage about the left sylvian fissure. 3. Underlying mild chronic microvascular ischemic disease.   MRA HEAD IMPRESSION: 1. Interval revascularization of previously seen left M1 occlusion. Left M1 segment now widely patent. No large vessel occlusion as seen. 2. Mild intracranial atherosclerotic disease, primarily involving the carotid siphons. No hemodynamically significant or correctable stenosis.   IR Emergent Large Vessel Occlusion Thrombolysis 02/04/23: IMPRESSION: Status post complete revascularization of occluded left MCA distal M1 segment with 1 pass with a 3 mm x 40 mm Solitaire X retrieval device, and contact aspiration achieving a TICI 3 revascularization.    CT Head & CTA Head/Neck 02/04/23: IMPRESSION: 1. No acute intracranial hemorrhage. ASPECT score is 10. 2. Acute occlusion of the left MCA,  mid M1 segment. Distal branches are symmetric. 3. No hemodynamically significant stenosis in the neck. Aortic Atherosclerosis (ICD10-I70.0) and Emphysema (ICD10-J43.9).    MRI C-spine 01/29/23 (Canopy/PACS): IMPRESSION: 1. Cervical spine degeneration causing right foraminal stenosis that is moderate at C4-5 and advanced at C5-6. 2. Diffusely patent spinal canal.   MRI L-spine 11/09/22 (Canopy/PACS): IMPRESSION: 1. At L4-5 there is a broad-based disc bulge. Severe bilateral facet arthropathy. Severe spinal stenosis. Moderate bilateral foraminal stenosis. 2. At L5-S1 there is a mild broad-based disc bulge. Severe right and moderate left facet arthropathy. No spinal stenosis. Mild bilateral foraminal stenosis. 3. At L3-4 there is a mild broad-based disc bulge. Mild bilateral facet arthropathy. Mild spinal stenosis. Bilateral lateral recess stenosis. 4. At L1-2 there is a mild broad-based disc bulge. Moderate bilateral facet arthropathy. Mild spinal stenosis. Mild right foraminal stenosis. 5. No acute osseous injury of the lumbar spine.   CT Chest LCS 07/19/22: IMPRESSION: 1. Lung-RADS 2, benign appearance or behavior. Continue annual screening with low-dose chest CT without contrast in 12 months. 2. Three-vessel coronary atherosclerosis. 3. Aortic Atherosclerosis (ICD10-I70.0) and Emphysema (ICD10-J43.9).   EKG: 02/26/23: NSR. Low voltage QRS. Poor r wave progression.    CV: Echo 02/05/23: IMPRESSIONS   1. Left ventricular ejection fraction, by estimation, is 60 to 65%. The  left ventricle has normal function. The left ventricle has no  regional  wall motion abnormalities. Left ventricular diastolic parameters are  indeterminate.   2. Right ventricular systolic function is normal. The right ventricular  size is normal.   3. The mitral valve is degenerative. Mild mitral valve regurgitation. No  evidence of mitral stenosis.   4. The aortic valve is normal in structure. Aortic  valve regurgitation is  not visualized. No aortic stenosis is present.   5. The inferior vena cava is normal in size with greater than 50%  Respiratory variability, suggesting right atrial pressure of 3 mmHg.    LE Venous US 02/05/23: Summary:  RIGHT:  - There is no evidence of deep vein thrombosis in the lower extremity.  - No cystic structure found in the popliteal fossa.    LEFT:  - There is no evidence of deep vein thrombosis in the lower extremity.  - No cystic structure found in the popliteal fossa.  - Incidental: Left proximal SFA 75-99% stenosis noted with calcific  plaque. PSV 545 cm/s    Nuclear stress test 10/25/15: Nuclear stress EF: 77%. Apical septal akinesis There was no ST segment deviation noted during stress. Defect 1: There is a medium defect of severe severity present in the apical anterior and apex location. Findings consistent with prior myocardial infarction. This is a low risk study.   Past Medical History:  Diagnosis Date   Anxiety    Arthritis    Atrial fibrillation (HCC)    COPD (chronic obstructive pulmonary disease) (HCC)    Depression    Hyperlipidemia    Hypertension    Mixed incontinence 2010   Osteopenia    Overweight    Prediabetes    Renal cyst    left   Stroke [facility])    recent stroke in May 2024   Tobacco abuse     Past Surgical History:  Procedure Laterality Date   BREAST EXCISIONAL BIOPSY Right 1972   benign   BREAST SURGERY  1972   benign tumor removed rt breast   BUNIONECTOMY  12/2002   IR CT HEAD LTD  02/04/2023   IR PERCUTANEOUS ART THROMBECTOMY/INFUSION INTRACRANIAL INC DIAG ANGIO  02/04/2023   RADIOLOGY WITH ANESTHESIA N/A 02/04/2023   Procedure: RADIOLOGY WITH ANESTHESIA;  Surgeon: Radiologist, Medication, MD;  Location: [facility];  Service: Radiology;  Laterality: N/A;   VAGINAL HYSTERECTOMY  1976    MEDICATIONS:  apixaban (ELIQUIS) 5 MG TABS tablet   ascorbic acid (VITAMIN C) 1000 MG tablet   bimatoprost (LUMIGAN)  0.01 % SOLN   buPROPion (WELLBUTRIN SR) 150 MG 12 hr tablet   Calcium Carbonate-Vitamin D (CALCIUM 600/VITAMIN D PO)   cycloSPORINE, PF, (CEQUA) 0.09 % SOLN   diltiazem (CARDIZEM CD) 120 MG 24 hr capsule   INCRUSE ELLIPTA 62.5 MCG/INH AEPB   loratadine (CLARITIN) 10 MG tablet   Multiple Vitamins-Minerals (CENTRUM SILVER PO)   nicotine (NICODERM CQ - DOSED IN MG/24 HOURS) 21 mg/24hr patch   rosuvastatin (CRESTOR) 40 MG tablet   VITAMIN D PO   vitamin E 180 MG (400 UNITS) capsule   No current facility-administered medications for this encounter.    Shonna Chock, PA-C Surgical Short Stay/Anesthesiology [facility] Phone [phone] [facility] Phone [phone] 05/24/2023 5:39 PM

## 2023-05-26 ENCOUNTER — Other Ambulatory Visit (HOSPITAL_BASED_OUTPATIENT_CLINIC_OR_DEPARTMENT_OTHER): Payer: Self-pay | Admitting: Family

## 2023-05-26 DIAGNOSIS — Z720 Tobacco use: Secondary | ICD-10-CM

## 2023-05-28 ENCOUNTER — Telehealth: Payer: Self-pay | Admitting: *Deleted

## 2023-05-28 ENCOUNTER — Telehealth: Payer: Self-pay | Admitting: Cardiology

## 2023-05-28 ENCOUNTER — Telehealth: Payer: Self-pay | Admitting: Neurology

## 2023-05-28 NOTE — Telephone Encounter (Signed)
Teresa Ford K7 minutes ago (10:25 AM)   AF Patient is returning call to schedule tele visit appointment.      Note   Teresa, Ford "Teresa Ford" [phone]  Tawni Millers

## 2023-05-28 NOTE — Telephone Encounter (Signed)
Pt has been scheduled for tele pre op appt add on ok per Bernadene Person, NP due to med hold and proc date. Tele appt 05/30/23 @ 10:40.    Med rec and consent are done.

## 2023-05-28 NOTE — Telephone Encounter (Signed)
Surgical clearance  Patient called stating she needed surgical clearance form filled out for 05/31/23 back surgery. At time of call, letter was faxed out on Friday 8/30. Spoke with Erie Noe at [facility], she will refax letter for approval.

## 2023-05-28 NOTE — Telephone Encounter (Signed)
   Name: Teresa Ford  DOB: 01/18/1946  MRN: [id number]  Primary Cardiologist: Jodelle Red, MD   Preoperative team, please contact this patient and set up a phone call appointment for further preoperative risk assessment. Please obtain consent and complete medication review. Thank you for your help.  I confirm that guidance regarding antiplatelet and oral anticoagulation therapy has been completed and, if necessary, noted below.  Per Dr Cristal Deer "She needs to understand the risk for holding her anticoagulation and risk of potential stroke. will also need to know how long surgeon plans to hold post procedure. Overall if she understands and accepts the risk then ok to hold as pharmd directed (3 days)."     Joylene Grapes, NP 05/28/2023, 9:13 AM [facility]

## 2023-05-28 NOTE — Telephone Encounter (Signed)
No answer - left message to return call.

## 2023-05-28 NOTE — Telephone Encounter (Signed)
ADDENDUM: PT HAS BEEN ADVISED TO NOT TAKE HER ELIQUIS UNTIL AFTER PROCEDURE WITH DR. DAVID Yetta Barre. PT AWARE DR. Yetta Barre WILL ADVISE WHEN SAFE TO RESUME ELIQUIS.   PT CONFIRMED SHE HAS NOT TAKEN HER ELIQUIS YET TODAY. PT ADVISED TO NOT TAKE UNTIL AFTER PROCEDURE.

## 2023-05-28 NOTE — Telephone Encounter (Signed)
chadsvasc risk is 8. she is high risk for holding anticoagulation given her CV score and prior stroke in May, but it is also a high bleeding risk procedure. She needs to understand the risk for holding her anticoagulation and risk of potential stroke. will also need to know how long surgeon plans to hold post procedure. Overall if she understands and accepts the risk then ok to hold as pharmd directed. otherwise would recommend bridging, though this is not something we typically do with DOAC.

## 2023-05-28 NOTE — Telephone Encounter (Signed)
 1st attempt at scheduling tele preop clearance. [email or web address]

## 2023-05-28 NOTE — Telephone Encounter (Signed)
Pt returned call. Please call back when available. 

## 2023-05-28 NOTE — Progress Notes (Signed)
Virtual Visit via Telephone Note   Because of Teresa Ford's co-morbid illnesses, she is at least at moderate risk for complications without adequate follow up.  This format is felt to be most appropriate for this patient at this time.  The patient did not have access to video technology/had technical difficulties with video requiring transitioning to audio format only (telephone).  All issues noted in this document were discussed and addressed.  No physical exam could be performed with this format.  Please refer to the patient's chart for her consent to telehealth for [facility].  Evaluation Performed:  Preoperative cardiovascular risk assessment _____________   Date:  05/30/2023   Patient ID:  Teresa Ford, Teresa Ford 09-Feb-1946, MRN [id number] Patient Location:  Home Provider location:   Office  Primary Care Provider:  Darrow Bussing, MD Primary Cardiologist:  Jodelle Red, MD  Chief Complaint / Patient Profile   77 y.o. y/o female with a h/o CVA, atrial fibrillation, CHA2DS2-VASc score of 8, hypertension, hyperlipidemia, coronary calcification on CT among noncardiac medical issues.   She is pending L4-L5 lumbar fusion by Dr. Marikay Alar on 05/31/2023, and presents today for telephonic preoperative cardiovascular risk assessment.  Also recommendations on holding Eliquis.  History of Present Illness    Teresa Ford is a 77 y.o. female who presents via audio/video conferencing for a telehealth visit today.  Pt was last seen in cardiology clinic on 02/26/2023 by Gillian Shields, NP.  At that time ORLEE MALLICOAT was doing well .  The patient is now pending procedure as outlined above. Since her last visit, she had done well from a cardiac standpoint, no edema, bleeding or palpitations.   Past Medical History    Past Medical History:  Diagnosis Date   Anxiety    Arthritis    Atrial fibrillation (HCC)    COPD (chronic obstructive pulmonary disease) (HCC)     Depression    Hyperlipidemia    Hypertension    Mixed incontinence 2010   Osteopenia    Overweight    Prediabetes    Renal cyst    left   Stroke [facility])    recent stroke in May 2024   Tobacco abuse    Past Surgical History:  Procedure Laterality Date   BREAST EXCISIONAL BIOPSY Right 1972   benign   BREAST SURGERY  1972   benign tumor removed rt breast   BUNIONECTOMY  12/2002   IR CT HEAD LTD  02/04/2023   IR PERCUTANEOUS ART THROMBECTOMY/INFUSION INTRACRANIAL INC DIAG ANGIO  02/04/2023   RADIOLOGY WITH ANESTHESIA N/A 02/04/2023   Procedure: RADIOLOGY WITH ANESTHESIA;  Surgeon: Radiologist, Medication, MD;  Location[facility];  Service: Radiology;  Laterality: N/A;   VAGINAL HYSTERECTOMY  1976    Allergies  Allergies  Allergen Reactions   Lamisil [Terbinafine] Rash    Home Medications    Prior to Admission medications   Medication Sig Start Date End Date Taking? Authorizing Provider  apixaban (ELIQUIS) 5 MG TABS tablet Take 1 tablet (5 mg total) by mouth 2 (two) times daily. 02/26/23   Alver Sorrow, NP  ascorbic acid (VITAMIN C) 1000 MG tablet Take 1,000 mg by mouth daily.    [provider]  bimatoprost (LUMIGAN) 0.01 % SOLN Place 1 drop into the right eye at bedtime.    [provider]  buPROPion (WELLBUTRIN SR) 150 MG 12 hr tablet Take 1 tablet (150 mg total) by mouth daily for 3 days, THEN 1 tablet (  150 mg total) 2 (two) times daily. 02/26/23 06/29/23  Alver Sorrow, NP  Calcium Carbonate-Vitamin D (CALCIUM 600/VITAMIN D PO) Take 2 tablets by mouth daily.    [provider]  cycloSPORINE, PF, (CEQUA) 0.09 % SOLN Place 1 drop into both eyes 2 (two) times daily.    [provider]  diltiazem (CARDIZEM CD) 120 MG 24 hr capsule Take 1 capsule (120 mg total) by mouth daily. 02/26/23   Alver Sorrow, NP  INCRUSE ELLIPTA 62.5 MCG/INH AEPB Inhale 1 puff into the lungs daily. 03/18/19   [provider]  loratadine (CLARITIN) 10 MG  tablet Take 10 mg by mouth daily as needed for allergies.    [provider]  Multiple Vitamins-Minerals (CENTRUM SILVER PO) Take 1 tablet by mouth daily.    [provider]  nicotine (NICODERM CQ - DOSED IN MG/24 HOURS) 21 mg/24hr patch Place 1 patch (21 mg total) onto the skin daily. Patient not taking: Reported on 05/28/2023 02/09/23   Mathews Argyle, NP  rosuvastatin (CRESTOR) 40 MG tablet Take 40 mg by mouth daily.    [provider]  VITAMIN D PO Take 1 tablet by mouth daily.    [provider]  vitamin E 180 MG (400 UNITS) capsule Take 400 Units by mouth daily.    [provider]    Physical Exam    Vital Signs:  Teresa Ford does not have vital signs available for review today.  Given telephonic nature of communication, physical exam is limited. AAOx3. NAD. Normal affect.  Speech and respirations are unlabored.  Accessory Clinical Findings    None  Assessment & Plan    1.  Preoperative Cardiovascular Risk Assessment:  According to the Revised Cardiac Risk Index (RCRI), her Perioperative Risk of Major Cardiac Event is (%): 0.4  Her Functional Capacity in METs is: 4.37 according to the Duke Activity Status Index (DASI).    CHA2DS2-VASc Score = 7  This indicates a 11.2% annual risk of stroke. The patient's score is based upon: CHF History: 0 HTN History: 1 Diabetes History: 0 Stroke History: 2 Vascular Disease History: 1 Age Score: 2 Gender Score: 1   CrCl 50 ml/min Platelet count 206   Spinal injections typically require 3 day Eliquis hold. However, pt was diagnosed with afib and had a stroke 3 months ago.   Per Dr. Cristal Deer 05/27/2023 Chads Vasc risk is 8. she is high risk for holding anticoagulation given her CV score and prior stroke in May, but it is also a high bleeding risk procedure. She needs to understand the risk for holding her anticoagulation and risk of potential stroke. will also need to know how long  surgeon plans to hold post procedure. Overall if she understands and accepts the risk then ok to hold as pharmd directed. otherwise would recommend bridging, though this is not something we typically do with DOAC."   Therefore, based on ACC/AHA guidelines, patient would be at acceptable risk for the planned procedure without further cardiovascular testing. I will route this recommendation to the requesting party via Epic fax function.   The patient was advised that if she develops new symptoms prior to surgery to contact our office to arrange for a follow-up visit, and she verbalized understanding.    Time:   Today, I have spent 10 minutes with the patient with telehealth technology discussing medical history, symptoms, and management plan.     Joni Reining, NP  05/30/2023, 10:54 AM

## 2023-05-28 NOTE — Telephone Encounter (Signed)
Patient is returning call to schedule tele visit appointment.

## 2023-05-28 NOTE — Telephone Encounter (Signed)
Pt has been scheduled for tele pre op appt add on ok per Bernadene Person, NP due to med hold and proc date. Tele appt 05/30/23 @ 10:40.   Med rec and consent are done.     Patient Consent for Virtual Visit        Teresa Ford has provided verbal consent on 05/28/2023 for a virtual visit (video or telephone).   CONSENT FOR VIRTUAL VISIT FOR:  Teresa Ford  By participating in this virtual visit I agree to the following:  I hereby voluntarily request, consent and authorize [facility] and its employed or contracted physicians, physician assistants, nurse practitioners or other licensed health care professionals (the Practitioner), to provide me with telemedicine health care services (the "Services") as deemed necessary by the treating Practitioner. I acknowledge and consent to receive the Services by the Practitioner via telemedicine. I understand that the telemedicine visit will involve communicating with the Practitioner through live audiovisual communication technology and the disclosure of certain medical information by electronic transmission. I acknowledge that I have been given the opportunity to request an in-person assessment or other available alternative prior to the telemedicine visit and am voluntarily participating in the telemedicine visit.  I understand that I have the right to withhold or withdraw my consent to the use of telemedicine in the course of my care at any time, without affecting my right to future care or treatment, and that the Practitioner or I may terminate the telemedicine visit at any time. I understand that I have the right to inspect all information obtained and/or recorded in the course of the telemedicine visit and may receive copies of available information for a reasonable fee.  I understand that some of the potential risks of receiving the Services via telemedicine include:  Delay or interruption in medical evaluation due to technological  equipment failure or disruption; Information transmitted may not be sufficient (e.g. poor resolution of images) to allow for appropriate medical decision making by the Practitioner; and/or  In rare instances, security protocols could fail, causing a breach of personal health information.  Furthermore, I acknowledge that it is my responsibility to provide information about my medical history, conditions and care that is complete and accurate to the best of my ability. I acknowledge that Practitioner's advice, recommendations, and/or decision may be based on factors not within their control, such as incomplete or inaccurate data provided by me or distortions of diagnostic images or specimens that may result from electronic transmissions. I understand that the practice of medicine is not an exact science and that Practitioner makes no warranties or guarantees regarding treatment outcomes. I acknowledge that a copy of this consent can be made available to me via my patient portal [facility] MyChart), or I can request a printed copy by calling the office of [facility].    I understand that my insurance will be billed for this visit.   I have read or had this consent read to me. I understand the contents of this consent, which adequately explains the benefits and risks of the Services being provided via telemedicine.  I have been provided ample opportunity to ask questions regarding this consent and the Services and have had my questions answered to my satisfaction. I give my informed consent for the services to be provided through the use of telemedicine in my medical care

## 2023-05-28 NOTE — Telephone Encounter (Signed)
Pt said, Dr. Marikay Alar at [facility] need surgical clearance for back surgery on 05/31/23. Would like a call from the nurse

## 2023-05-29 NOTE — Telephone Encounter (Signed)
Phone room, Please call pt and tell her the surgeon needs to fax [place] the surgical clearance form to [phone]. Thanks,  Production assistant, radio

## 2023-05-30 ENCOUNTER — Ambulatory Visit: Payer: Medicare Other | Attending: Internal Medicine

## 2023-05-30 DIAGNOSIS — Z01810 Encounter for preprocedural cardiovascular examination: Secondary | ICD-10-CM | POA: Diagnosis not present

## 2023-05-30 DIAGNOSIS — Z01818 Encounter for other preprocedural examination: Secondary | ICD-10-CM

## 2023-05-30 NOTE — Telephone Encounter (Addendum)
Faxed clearance form 05/24/23 and 05/28/23 put attention to April. Pt surgery is tomorrow morning at 7:30 am [facility]. Will faxing fax number given by April [phone]

## 2023-05-30 NOTE — Telephone Encounter (Signed)
Call to [facility] and spoke with [facility]. She confirms they received the clearance letter from Dr. Pearlean Brownie on 05/28/2023.

## 2023-05-30 NOTE — Anesthesia Preprocedure Evaluation (Addendum)
Anesthesia Evaluation  Patient identified by MRN, date of birth, ID band Patient confused    Reviewed: Allergy & Precautions, H&P , NPO status , Patient's Chart, lab work & pertinent test results  Airway Mallampati: II  TM Distance: >3 FB Neck ROM: Full    Dental no notable dental hx. (+) Teeth Intact, Dental Advisory Given   Pulmonary COPD, Patient abstained from smoking., former smoker   Pulmonary exam normal breath sounds clear to auscultation       Cardiovascular hypertension, Pt. on medications  Rhythm:Irregular Rate:Tachycardia     Neuro/Psych   Anxiety Depression    CVA, Residual Symptoms    GI/Hepatic negative GI ROS, Neg liver ROS,,,  Endo/Other  negative endocrine ROS    Renal/GU negative Renal ROS  negative genitourinary   Musculoskeletal  (+) Arthritis , Osteoarthritis,    Abdominal   Peds  Hematology negative hematology ROS (+)   Anesthesia Other Findings   Reproductive/Obstetrics negative OB ROS                             Anesthesia Physical Anesthesia Plan  ASA: 3  Anesthesia Plan: General   Post-op Pain Management: Tylenol PO (pre-op)* and Gabapentin PO (pre-op)*   Induction: Intravenous  PONV Risk Score and Plan: 3 and Ondansetron, Dexamethasone, Treatment may vary due to age or medical condition and Midazolam  Airway Management Planned: Oral ETT  Additional Equipment:   Intra-op Plan:   Post-operative Plan: Extubation in OR  Informed Consent: I have reviewed the patients History and Physical, chart, labs and discussed the procedure including the risks, benefits and alternatives for the proposed anesthesia with the patient or authorized representative who has indicated his/her understanding and acceptance.     Dental advisory given  Plan Discussed with: CRNA  Anesthesia Plan Comments: (PAT note written by Shonna Chock, PA-C.  )        Anesthesia Quick Evaluation

## 2023-05-31 ENCOUNTER — Observation Stay (HOSPITAL_COMMUNITY)
Admission: RE | Admit: 2023-05-31 | Discharge: 2023-06-01 | Disposition: A | Discharge status: Home / Self Care | Payer: Medicare Other | Source: Ambulatory Visit | Attending: Neurological Surgery | Admitting: Neurological Surgery

## 2023-05-31 ENCOUNTER — Inpatient Hospital Stay (HOSPITAL_COMMUNITY): Payer: Medicare Other | Admitting: Vascular Surgery

## 2023-05-31 ENCOUNTER — Encounter (HOSPITAL_COMMUNITY): Payer: Self-pay | Admitting: Neurological Surgery

## 2023-05-31 ENCOUNTER — Other Ambulatory Visit: Payer: Self-pay

## 2023-05-31 ENCOUNTER — Inpatient Hospital Stay (HOSPITAL_COMMUNITY): Payer: Medicare Other

## 2023-05-31 ENCOUNTER — Encounter (HOSPITAL_COMMUNITY)
Admission: RE | Disposition: A | Discharge status: Home / Self Care | Payer: Self-pay | Source: Ambulatory Visit | Attending: Neurological Surgery

## 2023-05-31 ENCOUNTER — Inpatient Hospital Stay (HOSPITAL_COMMUNITY): Payer: Medicare Other | Admitting: Anesthesiology

## 2023-05-31 DIAGNOSIS — Z8673 Personal history of transient ischemic attack (TIA), and cerebral infarction without residual deficits: Secondary | ICD-10-CM | POA: Diagnosis not present

## 2023-05-31 DIAGNOSIS — J449 Chronic obstructive pulmonary disease, unspecified: Secondary | ICD-10-CM | POA: Insufficient documentation

## 2023-05-31 DIAGNOSIS — I4891 Unspecified atrial fibrillation: Secondary | ICD-10-CM | POA: Insufficient documentation

## 2023-05-31 DIAGNOSIS — M4317 Spondylolisthesis, lumbosacral region: Principal | ICD-10-CM | POA: Insufficient documentation

## 2023-05-31 DIAGNOSIS — Z87891 Personal history of nicotine dependence: Secondary | ICD-10-CM | POA: Diagnosis not present

## 2023-05-31 DIAGNOSIS — M4807 Spinal stenosis, lumbosacral region: Secondary | ICD-10-CM | POA: Diagnosis not present

## 2023-05-31 DIAGNOSIS — Z981 Arthrodesis status: Secondary | ICD-10-CM | POA: Diagnosis not present

## 2023-05-31 DIAGNOSIS — M48061 Spinal stenosis, lumbar region without neurogenic claudication: Secondary | ICD-10-CM | POA: Diagnosis not present

## 2023-05-31 DIAGNOSIS — M4316 Spondylolisthesis, lumbar region: Secondary | ICD-10-CM | POA: Diagnosis not present

## 2023-05-31 DIAGNOSIS — Z7901 Long term (current) use of anticoagulants: Secondary | ICD-10-CM | POA: Diagnosis not present

## 2023-05-31 DIAGNOSIS — I10 Essential (primary) hypertension: Secondary | ICD-10-CM | POA: Insufficient documentation

## 2023-05-31 DIAGNOSIS — Z79899 Other long term (current) drug therapy: Secondary | ICD-10-CM | POA: Insufficient documentation

## 2023-05-31 LAB — ABO/RH: ABO/RH(D): A POS

## 2023-05-31 SURGERY — POSTERIOR LUMBAR FUSION 2 LEVEL
Anesthesia: General | Site: Back

## 2023-05-31 MED ORDER — MIDAZOLAM HCL 2 MG/2ML IJ SOLN
INTRAMUSCULAR | Status: DC | PRN
  Administered 2023-05-31: 2 mg via INTRAVENOUS

## 2023-05-31 MED ORDER — ONDANSETRON HCL 4 MG PO TABS: 4.0000 mg | ORAL_TABLET | Freq: Four times a day (QID) | ORAL | Status: DC | PRN

## 2023-05-31 MED ORDER — LACTATED RINGERS IV SOLN
INTRAVENOUS | Status: DC | PRN
Start: 2023-05-31 — End: 2023-05-31

## 2023-05-31 MED ORDER — MENTHOL 3 MG MT LOZG: 1.0000 | LOZENGE | OROMUCOSAL | Status: DC | PRN

## 2023-05-31 MED ORDER — CELECOXIB 200 MG PO CAPS
200.0000 mg | ORAL_CAPSULE | Freq: Two times a day (BID) | ORAL | Status: DC
  Administered 2023-05-31 – 2023-06-01 (×3): 200 mg via ORAL
  Filled 2023-05-31 (×3): qty 1

## 2023-05-31 MED ORDER — HYDROCODONE-ACETAMINOPHEN 10-325 MG PO TABS
1.0000 | ORAL_TABLET | ORAL | Status: DC | PRN
  Administered 2023-05-31 (×2): 1 via ORAL
  Filled 2023-05-31 (×3): qty 1

## 2023-05-31 MED ORDER — FENTANYL CITRATE (PF) 250 MCG/5ML IJ SOLN
INTRAMUSCULAR | Status: AC
  Filled 2023-05-31: qty 5

## 2023-05-31 MED ORDER — BUPIVACAINE HCL (PF) 0.25 % IJ SOLN
INTRAMUSCULAR | Status: DC | PRN
  Administered 2023-05-31: 6 mL

## 2023-05-31 MED ORDER — CHLORHEXIDINE GLUCONATE CLOTH 2 % EX PADS: 6.0000 | MEDICATED_PAD | Freq: Once | CUTANEOUS | Status: DC

## 2023-05-31 MED ORDER — BUPIVACAINE HCL (PF) 0.25 % IJ SOLN
INTRAMUSCULAR | Status: AC
  Filled 2023-05-31: qty 30

## 2023-05-31 MED ORDER — DEXAMETHASONE 4 MG PO TABS
4.0000 mg | ORAL_TABLET | Freq: Four times a day (QID) | ORAL | Status: DC
  Administered 2023-05-31 – 2023-06-01 (×2): 4 mg via ORAL
  Filled 2023-05-31 (×2): qty 1

## 2023-05-31 MED ORDER — UMECLIDINIUM BROMIDE 62.5 MCG/ACT IN AEPB: 1.0000 | INHALATION_SPRAY | Freq: Every day | RESPIRATORY_TRACT | Status: DC

## 2023-05-31 MED ORDER — PHENYLEPHRINE HCL-NACL 20-0.9 MG/250ML-% IV SOLN
INTRAVENOUS | Status: DC | PRN
  Administered 2023-05-31: 25 ug/min via INTRAVENOUS

## 2023-05-31 MED ORDER — ROSUVASTATIN CALCIUM 20 MG PO TABS
40.0000 mg | ORAL_TABLET | Freq: Every day | ORAL | Status: DC
  Administered 2023-05-31: 40 mg via ORAL
  Filled 2023-05-31: qty 2

## 2023-05-31 MED ORDER — SODIUM CHLORIDE 0.9 % IV SOLN
250.0000 mL | INTRAVENOUS | Status: DC
  Administered 2023-05-31: 250 mL via INTRAVENOUS

## 2023-05-31 MED ORDER — DEXAMETHASONE SODIUM PHOSPHATE 4 MG/ML IJ SOLN
4.0000 mg | Freq: Four times a day (QID) | INTRAMUSCULAR | Status: DC
  Administered 2023-05-31: 4 mg via INTRAVENOUS
  Filled 2023-05-31: qty 1

## 2023-05-31 MED ORDER — FENTANYL CITRATE (PF) 250 MCG/5ML IJ SOLN
INTRAMUSCULAR | Status: DC | PRN
  Administered 2023-05-31 (×2): 50 ug via INTRAVENOUS
  Administered 2023-05-31: 100 ug via INTRAVENOUS
  Administered 2023-05-31: 50 ug via INTRAVENOUS

## 2023-05-31 MED ORDER — DEXAMETHASONE SODIUM PHOSPHATE 10 MG/ML IJ SOLN
INTRAMUSCULAR | Status: DC | PRN
  Administered 2023-05-31: 10 mg via INTRAVENOUS

## 2023-05-31 MED ORDER — ACETAMINOPHEN 500 MG PO TABS: 1000.0000 mg | ORAL_TABLET | ORAL | Status: AC

## 2023-05-31 MED ORDER — OXYCODONE HCL 5 MG PO TABS: 5.0000 mg | ORAL_TABLET | Freq: Once | ORAL | Status: DC | PRN

## 2023-05-31 MED ORDER — POTASSIUM CHLORIDE IN NACL 20-0.9 MEQ/L-% IV SOLN: INTRAVENOUS | Status: DC

## 2023-05-31 MED ORDER — CEFAZOLIN SODIUM-DEXTROSE 2-4 GM/100ML-% IV SOLN
INTRAVENOUS | Status: AC
  Filled 2023-05-31: qty 100

## 2023-05-31 MED ORDER — THROMBIN 20000 UNITS EX SOLR
CUTANEOUS | Status: DC | PRN
  Administered 2023-05-31: 20 mL via TOPICAL

## 2023-05-31 MED ORDER — THROMBIN 20000 UNITS EX SOLR
CUTANEOUS | Status: AC
  Filled 2023-05-31: qty 20000

## 2023-05-31 MED ORDER — CEFAZOLIN SODIUM-DEXTROSE 2-4 GM/100ML-% IV SOLN: 2.0000 g | INTRAVENOUS | Status: DC

## 2023-05-31 MED ORDER — CEFAZOLIN SODIUM-DEXTROSE 2-3 GM-%(50ML) IV SOLR
INTRAVENOUS | Status: DC | PRN
Start: 2023-05-31 — End: 2023-05-31
  Administered 2023-05-31: 2 g via INTRAVENOUS

## 2023-05-31 MED ORDER — CHLORHEXIDINE GLUCONATE 0.12 % MT SOLN
OROMUCOSAL | Status: AC
  Administered 2023-05-31: 15 mL via OROMUCOSAL
  Filled 2023-05-31: qty 15

## 2023-05-31 MED ORDER — HYDROMORPHONE HCL 1 MG/ML IJ SOLN
INTRAMUSCULAR | Status: AC
  Filled 2023-05-31: qty 1

## 2023-05-31 MED ORDER — PROPOFOL 10 MG/ML IV BOLUS
INTRAVENOUS | Status: DC | PRN
  Administered 2023-05-31: 120 mg via INTRAVENOUS

## 2023-05-31 MED ORDER — CHLORHEXIDINE GLUCONATE 0.12 % MT SOLN: 15.0000 mL | Freq: Once | OROMUCOSAL | Status: AC

## 2023-05-31 MED ORDER — PHENYLEPHRINE 80 MCG/ML (10ML) SYRINGE FOR IV PUSH (FOR BLOOD PRESSURE SUPPORT)
PREFILLED_SYRINGE | INTRAVENOUS | Status: DC | PRN
  Administered 2023-05-31 (×5): 160 ug via INTRAVENOUS

## 2023-05-31 MED ORDER — SENNA 8.6 MG PO TABS
1.0000 | ORAL_TABLET | Freq: Two times a day (BID) | ORAL | Status: DC
  Administered 2023-05-31: 8.6 mg via ORAL
  Filled 2023-05-31: qty 1

## 2023-05-31 MED ORDER — OXYCODONE HCL 5 MG/5ML PO SOLN: 5.0000 mg | Freq: Once | ORAL | Status: DC | PRN

## 2023-05-31 MED ORDER — LACTATED RINGERS IV SOLN: INTRAVENOUS | Status: DC

## 2023-05-31 MED ORDER — HYDROMORPHONE HCL 1 MG/ML IJ SOLN
0.2500 mg | INTRAMUSCULAR | Status: DC | PRN
  Administered 2023-05-31: 0.5 mg via INTRAVENOUS

## 2023-05-31 MED ORDER — MIDAZOLAM HCL 2 MG/2ML IJ SOLN
INTRAMUSCULAR | Status: AC
  Filled 2023-05-31: qty 2

## 2023-05-31 MED ORDER — ACETAMINOPHEN 650 MG RE SUPP: 650.0000 mg | RECTAL | Status: DC | PRN

## 2023-05-31 MED ORDER — ACETAMINOPHEN 325 MG PO TABS
650.0000 mg | ORAL_TABLET | ORAL | Status: DC | PRN
  Administered 2023-06-01: 650 mg via ORAL
  Filled 2023-05-31: qty 2

## 2023-05-31 MED ORDER — METHOCARBAMOL 1000 MG/10ML IJ SOLN: 500.0000 mg | Freq: Four times a day (QID) | INTRAVENOUS | Status: DC | PRN

## 2023-05-31 MED ORDER — DILTIAZEM HCL ER COATED BEADS 120 MG PO CP24
120.0000 mg | ORAL_CAPSULE | Freq: Every day | ORAL | Status: DC
  Filled 2023-05-31: qty 1

## 2023-05-31 MED ORDER — EPHEDRINE SULFATE-NACL 50-0.9 MG/10ML-% IV SOSY
PREFILLED_SYRINGE | INTRAVENOUS | Status: DC | PRN
  Administered 2023-05-31 (×2): 10 mg via INTRAVENOUS

## 2023-05-31 MED ORDER — BUPROPION HCL ER (SR) 150 MG PO TB12
150.0000 mg | ORAL_TABLET | Freq: Two times a day (BID) | ORAL | Status: DC
  Administered 2023-05-31: 150 mg via ORAL
  Filled 2023-05-31: qty 1

## 2023-05-31 MED ORDER — CEFAZOLIN SODIUM-DEXTROSE 2-4 GM/100ML-% IV SOLN
2.0000 g | Freq: Three times a day (TID) | INTRAVENOUS | Status: AC
  Administered 2023-05-31 (×2): 2 g via INTRAVENOUS
  Filled 2023-05-31 (×2): qty 100

## 2023-05-31 MED ORDER — ONDANSETRON HCL 4 MG/2ML IJ SOLN: 4.0000 mg | Freq: Four times a day (QID) | INTRAMUSCULAR | Status: DC | PRN

## 2023-05-31 MED ORDER — ONDANSETRON HCL 4 MG/2ML IJ SOLN
INTRAMUSCULAR | Status: DC | PRN
  Administered 2023-05-31: 4 mg via INTRAVENOUS

## 2023-05-31 MED ORDER — SODIUM CHLORIDE 0.9% FLUSH
3.0000 mL | Freq: Two times a day (BID) | INTRAVENOUS | Status: DC
  Administered 2023-05-31: 3 mL via INTRAVENOUS

## 2023-05-31 MED ORDER — ORAL CARE MOUTH RINSE: 15.0000 mL | Freq: Once | OROMUCOSAL | Status: AC

## 2023-05-31 MED ORDER — ACETAMINOPHEN 500 MG PO TABS
ORAL_TABLET | ORAL | Status: AC
  Administered 2023-05-31: 1000 mg via ORAL
  Filled 2023-05-31: qty 2

## 2023-05-31 MED ORDER — SURGIPHOR WOUND IRRIGATION SYSTEM - OPTIME
TOPICAL | Status: DC | PRN
  Administered 2023-05-31: 450 mL via TOPICAL

## 2023-05-31 MED ORDER — THROMBIN 5000 UNITS EX SOLR
OROMUCOSAL | Status: DC | PRN
  Administered 2023-05-31: 5 mL via TOPICAL

## 2023-05-31 MED ORDER — PROMETHAZINE HCL 25 MG/ML IJ SOLN: 6.2500 mg | INTRAMUSCULAR | Status: DC | PRN

## 2023-05-31 MED ORDER — SUGAMMADEX SODIUM 200 MG/2ML IV SOLN
INTRAVENOUS | Status: DC | PRN
  Administered 2023-05-31: 200 mg via INTRAVENOUS

## 2023-05-31 MED ORDER — AMISULPRIDE (ANTIEMETIC) 5 MG/2ML IV SOLN: 10.0000 mg | Freq: Once | INTRAVENOUS | Status: DC | PRN

## 2023-05-31 MED ORDER — THROMBIN 5000 UNITS EX SOLR
CUTANEOUS | Status: AC
  Filled 2023-05-31: qty 5000

## 2023-05-31 MED ORDER — GABAPENTIN 300 MG PO CAPS
ORAL_CAPSULE | ORAL | Status: AC
  Administered 2023-05-31: 300 mg via ORAL
  Filled 2023-05-31: qty 1

## 2023-05-31 MED ORDER — SODIUM CHLORIDE 0.9% FLUSH: 3.0000 mL | INTRAVENOUS | Status: DC | PRN

## 2023-05-31 MED ORDER — LIDOCAINE 2% (20 MG/ML) 5 ML SYRINGE
INTRAMUSCULAR | Status: DC | PRN
  Administered 2023-05-31: 60 mg via INTRAVENOUS

## 2023-05-31 MED ORDER — ROCURONIUM BROMIDE 10 MG/ML (PF) SYRINGE
PREFILLED_SYRINGE | INTRAVENOUS | Status: DC | PRN
  Administered 2023-05-31: 20 mg via INTRAVENOUS
  Administered 2023-05-31: 60 mg via INTRAVENOUS
  Administered 2023-05-31: 10 mg via INTRAVENOUS

## 2023-05-31 MED ORDER — GABAPENTIN 300 MG PO CAPS: 300.0000 mg | ORAL_CAPSULE | ORAL | Status: AC

## 2023-05-31 MED ORDER — LATANOPROST 0.005 % OP SOLN
1.0000 [drp] | Freq: Every day | OPHTHALMIC | Status: DC
  Administered 2023-05-31: 1 [drp] via OPHTHALMIC
  Filled 2023-05-31: qty 2.5

## 2023-05-31 MED ORDER — CYCLOSPORINE 0.05 % OP EMUL
1.0000 [drp] | Freq: Two times a day (BID) | OPHTHALMIC | Status: DC
  Administered 2023-05-31: 1 [drp] via OPHTHALMIC
  Filled 2023-05-31 (×2): qty 30

## 2023-05-31 MED ORDER — PHENOL 1.4 % MT LIQD: 1.0000 | OROMUCOSAL | Status: DC | PRN

## 2023-05-31 MED ORDER — METHOCARBAMOL 500 MG PO TABS
500.0000 mg | ORAL_TABLET | Freq: Four times a day (QID) | ORAL | Status: DC | PRN
  Administered 2023-05-31 (×2): 500 mg via ORAL
  Filled 2023-05-31 (×3): qty 1

## 2023-05-31 MED ORDER — 0.9 % SODIUM CHLORIDE (POUR BTL) OPTIME
TOPICAL | Status: DC | PRN
  Administered 2023-05-31: 1000 mL

## 2023-05-31 MED ORDER — MORPHINE SULFATE (PF) 2 MG/ML IV SOLN: 2.0000 mg | INTRAVENOUS | Status: DC | PRN

## 2023-05-31 SURGICAL SUPPLY — 71 items
ADH SKN CLS APL DERMABOND .7 (GAUZE/BANDAGES/DRESSINGS) ×1
APL SKNCLS STERI-STRIP NONHPOA (GAUZE/BANDAGES/DRESSINGS) ×1
BAG COUNTER SPONGE SURGICOUNT (BAG) ×1 IMPLANT
BAG SPNG CNTER NS LX DISP (BAG) ×1
BASKET BONE COLLECTION (BASKET) ×1 IMPLANT
BENZOIN TINCTURE PRP APPL 2/3 (GAUZE/BANDAGES/DRESSINGS) ×1 IMPLANT
BLADE BONE MILL MEDIUM (MISCELLANEOUS) ×1 IMPLANT
BLADE CLIPPER SURG (BLADE) IMPLANT
BONE FIBERS PLIAFX 5ML (Bone Implant) ×1 IMPLANT
BUR CARBIDE MATCH 3.0 (BURR) ×1 IMPLANT
CANISTER SUCT 3000ML PPV (MISCELLANEOUS) ×1 IMPLANT
CNTNR URN SCR LID CUP LEK RST (MISCELLANEOUS) ×1 IMPLANT
CONT SPEC 4OZ STRL OR WHT (MISCELLANEOUS) ×1
COVER BACK TABLE 60X90IN (DRAPES) ×1 IMPLANT
DERMABOND ADVANCED .7 DNX12 (GAUZE/BANDAGES/DRESSINGS) ×1 IMPLANT
DRAPE C-ARM 42X72 X-RAY (DRAPES) ×2 IMPLANT
DRAPE C-ARMOR (DRAPES) ×2 IMPLANT
DRAPE LAPAROTOMY 100X72X124 (DRAPES) ×1 IMPLANT
DRAPE SURG 17X23 STRL (DRAPES) ×1 IMPLANT
DRSG OPSITE POSTOP 4X6 (GAUZE/BANDAGES/DRESSINGS) IMPLANT
DURAPREP 26ML APPLICATOR (WOUND CARE) ×1 IMPLANT
ELECT REM PT RETURN 9FT ADLT (ELECTROSURGICAL) ×1
ELECTRODE REM PT RTRN 9FT ADLT (ELECTROSURGICAL) ×1 IMPLANT
EVACUATOR 1/8 PVC DRAIN (DRAIN) ×1 IMPLANT
GAUZE 4X4 16PLY ~~LOC~~+RFID DBL (SPONGE) IMPLANT
GLOVE BIO SURGEON STRL SZ7 (GLOVE) IMPLANT
GLOVE BIO SURGEON STRL SZ8 (GLOVE) ×2 IMPLANT
GLOVE BIOGEL PI IND STRL 7.0 (GLOVE) IMPLANT
GOWN STRL REUS W/ TWL LRG LVL3 (GOWN DISPOSABLE) IMPLANT
GOWN STRL REUS W/ TWL XL LVL3 (GOWN DISPOSABLE) ×2 IMPLANT
GOWN STRL REUS W/TWL 2XL LVL3 (GOWN DISPOSABLE) IMPLANT
GOWN STRL REUS W/TWL LRG LVL3 (GOWN DISPOSABLE)
GOWN STRL REUS W/TWL XL LVL3 (GOWN DISPOSABLE) ×2
GRAFT BNE FBR PLIAFX PRIME 5 (Bone Implant) IMPLANT
HEMOSTAT POWDER KIT SURGIFOAM (HEMOSTASIS) ×1 IMPLANT
KIT BASIN OR (CUSTOM PROCEDURE TRAY) ×1 IMPLANT
KIT GRAFTMAG DEL NEURO DISP (NEUROSURGERY SUPPLIES) IMPLANT
KIT INFUSE X SMALL 1.4CC (Orthopedic Implant) IMPLANT
KIT POSITION SURG JACKSON T1 (MISCELLANEOUS) ×1 IMPLANT
KIT TURNOVER KIT B (KITS) ×1 IMPLANT
MILL BONE PREP (MISCELLANEOUS) ×1 IMPLANT
NDL HYPO 25X1 1.5 SAFETY (NEEDLE) ×1 IMPLANT
NEEDLE HYPO 25X1 1.5 SAFETY (NEEDLE) ×1 IMPLANT
NS IRRIG 1000ML POUR BTL (IV SOLUTION) ×1 IMPLANT
PACK LAMINECTOMY NEURO (CUSTOM PROCEDURE TRAY) ×1 IMPLANT
PAD ARMBOARD 7.5X6 YLW CONV (MISCELLANEOUS) ×3 IMPLANT
ROD LORD LIPPED TI 5.5X60 (Rod) IMPLANT
SCREW ILIAC PA 5.5X40 (Screw) IMPLANT
SCREW KODIAK 6.5X40 (Screw) IMPLANT
SCREW POLYAXIAL TULIP (Screw) IMPLANT
SCREW SHANK MOD 5.5X40 (Screw) IMPLANT
SET SCREW (Screw) ×6 IMPLANT
SET SCREW SPNE (Screw) IMPLANT
SOLUTION IRRIG SURGIPHOR (IV SOLUTION) ×1 IMPLANT
SPACER IDENT 9X10X25 10D (Spacer) IMPLANT
SPACER IDENTITI 10X9X25 15D (Spacer) IMPLANT
SPACER IDENTITI 9X10X25 10D (Spacer) ×1 IMPLANT
SPACER IDENTITI PS 10X9X25 10D (Spacer) IMPLANT
SPONGE SURGIFOAM ABS GEL 100 (HEMOSTASIS) ×1 IMPLANT
SPONGE T-LAP 4X18 ~~LOC~~+RFID (SPONGE) IMPLANT
STRIP CLOSURE SKIN 1/2X4 (GAUZE/BANDAGES/DRESSINGS) ×2 IMPLANT
SUT VIC AB 0 CT1 18XCR BRD8 (SUTURE) ×1 IMPLANT
SUT VIC AB 0 CT1 8-18 (SUTURE) ×1
SUT VIC AB 2-0 CP2 18 (SUTURE) ×1 IMPLANT
SUT VIC AB 3-0 SH 8-18 (SUTURE) ×2 IMPLANT
SYR CONTROL 10ML LL (SYRINGE) ×1 IMPLANT
TOWEL GREEN STERILE (TOWEL DISPOSABLE) ×1 IMPLANT
TOWEL GREEN STERILE FF (TOWEL DISPOSABLE) ×1 IMPLANT
TRAY FOLEY MTR SLVR 16FR STAT (SET/KITS/TRAYS/PACK) ×1 IMPLANT
TRAY FOLEY W/BAG SLVR 14FR (SET/KITS/TRAYS/PACK) IMPLANT
WATER STERILE IRR 1000ML POUR (IV SOLUTION) ×1 IMPLANT

## 2023-05-31 NOTE — Progress Notes (Signed)
Patient came from PACU alert and oriented, surgical site clean and dry. Patient document brought with patient soak with coffee while in PACU

## 2023-05-31 NOTE — H&P (Signed)
Subjective: Patient is a 77 y.o. female admitted for back and leg pain. Onset of symptoms was several years ago, gradually worsening since that time.  The pain is rated severe, and is located at the across the lower back and radiates to legs. The pain is described as aching and occurs all day. The symptoms have been progressive. Symptoms are exacerbated by exercise, standing, and walking for more than a few minutes. MRI or CT showed spondylolisthesis with stenosis L4-5 L5-S1  Past Medical History:  Diagnosis Date   Anxiety    Arthritis    Atrial fibrillation (HCC)    COPD (chronic obstructive pulmonary disease) (HCC)    Depression    Hyperlipidemia    Hypertension    Mixed incontinence 2010   Osteopenia    Overweight    Prediabetes    Renal cyst    left   Stroke [facility])    recent stroke in May 2024   Tobacco abuse     Past Surgical History:  Procedure Laterality Date   BREAST EXCISIONAL BIOPSY Right 1972   benign   BREAST SURGERY  1972   benign tumor removed rt breast   BUNIONECTOMY  12/2002   IR CT HEAD LTD  02/04/2023   IR PERCUTANEOUS ART THROMBECTOMY/INFUSION INTRACRANIAL INC DIAG ANGIO  02/04/2023   RADIOLOGY WITH ANESTHESIA N/A 02/04/2023   Procedure: RADIOLOGY WITH ANESTHESIA;  Surgeon: Radiologist, Medication, MD;  Location: [facility] OR;  Service: Radiology;  Laterality: N/A;   VAGINAL HYSTERECTOMY  1976    Prior to Admission medications   Medication Sig Start Date End Date Taking? Authorizing Provider  apixaban (ELIQUIS) 5 MG TABS tablet Take 1 tablet (5 mg total) by mouth 2 (two) times daily. 02/26/23  Yes Alver Sorrow, NP  ascorbic acid (VITAMIN C) 1000 MG tablet Take 1,000 mg by mouth daily.   Yes [provider]  bimatoprost (LUMIGAN) 0.01 % SOLN Place 1 drop into the right eye at bedtime.   Yes [provider]  buPROPion (WELLBUTRIN SR) 150 MG 12 hr tablet Take 1 tablet (150 mg total) by mouth daily for 3 days, THEN 1 tablet (150 mg total) 2 (two) times  daily. 02/26/23 06/29/23 Yes Alver Sorrow, NP  Calcium Carbonate-Vitamin D (CALCIUM 600/VITAMIN D PO) Take 2 tablets by mouth daily.   Yes [provider]  cycloSPORINE, PF, (CEQUA) 0.09 % SOLN Place 1 drop into both eyes 2 (two) times daily.   Yes [provider]  diltiazem (CARDIZEM CD) 120 MG 24 hr capsule Take 1 capsule (120 mg total) by mouth daily. 02/26/23  Yes Alver Sorrow, NP  loratadine (CLARITIN) 10 MG tablet Take 10 mg by mouth daily as needed for allergies.   Yes [provider]  Multiple Vitamins-Minerals (CENTRUM SILVER PO) Take 1 tablet by mouth daily.   Yes [provider]  rosuvastatin (CRESTOR) 40 MG tablet Take 40 mg by mouth daily.   Yes [provider]  VITAMIN D PO Take 1 tablet by mouth daily.   Yes [provider]  vitamin E 180 MG (400 UNITS) capsule Take 400 Units by mouth daily.   Yes [provider]  INCRUSE ELLIPTA 62.5 MCG/INH AEPB Inhale 1 puff into the lungs daily. 03/18/19   [provider]  nicotine (NICODERM CQ - DOSED IN MG/24 HOURS) 21 mg/24hr patch Place 1 patch (21 mg total) onto the skin daily. Patient not taking: Reported on 05/28/2023 02/09/23   Mathews Argyle, NP  Allergies  Allergen Reactions   Lamisil [Terbinafine] Rash    Social History   Tobacco Use   Smoking status: Former    Current packs/day: 0.00    Average packs/day: 1 pack/day for 40.0 years (40.0 ttl pk-yrs)    Types: Cigarettes    Start date: 02/04/1983    Quit date: 02/04/2023    Years since quitting: 0.3   Smokeless tobacco: Never  Substance Use Topics   Alcohol use: Yes    Alcohol/week: 3.0 - 4.0 standard drinks of alcohol    Types: 3 - 4 Glasses of wine per week    Family History  Problem Relation Age of Onset   Heart disease Mother    Dementia Mother    Osteoporosis Mother    Diabetes Father    Heart attack Father    Cancer Sister        lung cancer     Review of Systems  Positive ROS:  neg  All other systems have been reviewed and were otherwise negative with the exception of those mentioned in the HPI and as above.  Objective: Vital signs in last 24 hours: Temp:  [98.1 F (36.7 C)] 98.1 F (36.7 C) (09/06 1610) Pulse Rate:  [72] 72 (09/06 0608) Resp:  [18] 18 (09/06 9604) BP: (111)/(77) 111/77 (09/06 0608) SpO2:  [96 %] 96 % (09/06 0608) Weight:  [60.8 kg] 60.8 kg (09/06 0608)  General Appearance: Alert, cooperative, no distress, appears stated age Head: Normocephalic, without obvious abnormality, atraumatic Eyes: PERRL, conjunctiva/corneas clear, EOM's intact    Neck: Supple, symmetrical, trachea midline Back: Symmetric, no curvature, ROM normal, no CVA tenderness Lungs:  respirations unlabored Heart: Regular rate and rhythm Abdomen: Soft, non-tender Extremities: Extremities normal, atraumatic, no cyanosis or edema Pulses: 2+ and symmetric all extremities Skin: Skin color, texture, turgor normal, no rashes or lesions  NEUROLOGIC:   Mental status: Alert and oriented x4,  no aphasia, good attention span, fund of knowledge, and memory Motor Exam - grossly normal Sensory Exam - grossly normal Reflexes: 1= Coordination - grossly normal Gait - grossly normal Balance - grossly normal Cranial Nerves: I: smell Not tested  II: visual acuity  OS: nl    OD: nl  II: visual fields Full to confrontation  II: pupils Equal, round, reactive to light  III,VII: ptosis None  III,IV,VI: extraocular muscles  Full ROM  V: mastication Normal  V: facial light touch sensation  Normal  V,VII: corneal reflex  Present  VII: facial muscle function - upper  Normal  VII: facial muscle function - lower Normal  VIII: hearing Not tested  IX: soft palate elevation  Normal  IX,X: gag reflex Present  XI: trapezius strength  5/5  XI: sternocleidomastoid strength 5/5  XI: neck flexion strength  5/5  XII: tongue strength  Normal    Data Review Lab Results  Component Value Date    WBC 6.3 05/23/2023   HGB 14.4 05/23/2023   HCT 41.7 05/23/2023   MCV 98.3 05/23/2023   PLT 225 05/23/2023   Lab Results  Component Value Date   NA 137 05/23/2023   K 3.9 05/23/2023   CL 101 05/23/2023   CO2 27 05/23/2023   BUN 15 05/23/2023   CREATININE 1.02 (H) 05/23/2023   GLUCOSE 110 (H) 05/23/2023   Lab Results  Component Value Date   INR 1.2 05/23/2023    Assessment/Plan:  Estimated body mass index is 24.51 kg/m as calculated from the following:   Height as of  this encounter: 5\' 2"  (1.575 m).   Weight as of this encounter: 60.8 kg. Patient admitted for PLIF L4-5 L5-S1. Patient has failed a reasonable attempt at conservative therapy.  I explained the condition and procedure to the patient and answered any questions.  Patient wishes to proceed with procedure as planned. Understands risks/ benefits and typical outcomes of procedure.  She had a stroke about 4 months ago, and understands there is risk of stroke (I spoke personally to Dr. Pearlean Brownie who felt she was clear for surgery after 3 months), but she is in such pain that she is adamant she wants this surgery and does not want to wait further.   Tia Alert 05/31/2023 7:26 AM

## 2023-05-31 NOTE — Op Note (Signed)
05/31/2023  11:39 AM  PATIENT:  Teresa Ford  77 y.o. 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 Decompressive lumbar laminectomy, hemi facetectomy and foraminotomies L4-5 and L5-S1 with Gill type decompression at L5-S1 requiring more work than would be required for a simple exposure of the disk for PLIF in order to adequately decompress the neural elements and address the spinal stenosis 2. Posterior lumbar interbody fusion L4-5 and L5-S1 using PTI interbody cages packed with morcellized allograft and autograft  3. Posterior fixation L4-S1 inclusive using Alphatec cortical pedicle screws.  4. Intertransverse arthrodesis L4-S1 using morcellized autograft and allograft.  SURGEON:  Marikay Alar, MD  ASSISTANTS: Verlin Dike, FNP  ANESTHESIA:  General  EBL: 500 ml  Total I/O In: 1800 [I.V.:1800] Out: 850 [Urine:350; Blood:500]  BLOOD ADMINISTERED:none  DRAINS: none   INDICATION FOR PROCEDURE: This patient presented with neck pain with bilateral leg pain. Imaging revealed spondylolisthesis with stenosis at L4-5 and L5-S1. The patient tried a reasonable attempt at conservative medical measures without relief. I recommended decompression and instrumented fusion to address the stenosis as well as the segmental  instability.  Patient understood the risks, benefits, and alternatives and potential outcomes and wished to proceed.  PROCEDURE DETAILS:  The patient was brought to the operating room. After induction of generalized endotracheal anesthesia the patient was rolled into the prone position on chest rolls and all pressure points were padded. The patient's lumbar region was cleaned and then prepped with DuraPrep and draped in the usual sterile fashion. Anesthesia was injected and then a  dorsal midline incision was made and carried down to the lumbosacral fascia. The fascia was opened and the paraspinous musculature was taken down in a subperiosteal fashion to expose L4-5 and L5-S1. A self-retaining retractor was placed. Intraoperative fluoroscopy confirmed my level, and I started with placement of the L4 cortical pedicle screws. The pedicle screw entry zones were identified utilizing surface landmarks and  AP and lateral fluoroscopy. I scored the cortex with the high-speed drill and then used the hand drill to drill an upward and outward direction into the pedicle. I then tapped line to line. I then placed a 5.5 x 40 mm cortical pedicle screw into the pedicles of L4 bilaterally.    I then turned my attention to the decompression and complete lumbar laminectomies, hemi- facetectomies, and foraminotomies were performed at L4 5 and L5-S1.  She had a lytic spondylolisthesis at L5-S1 and therefore a Gill type decompression was performed at this level.  My nurse practitioner was directly involved in the decompression and exposure of the neural elements. the patient had significant spinal stenosis and this required more work than would be required for a simple exposure of the disc for posterior lumbar interbody fusion which would only require a limited laminotomy. Much more generous decompression and generous foraminotomy was undertaken in order to adequately decompress the neural elements and address the patient's leg pain. The yellow ligament was removed to expose the underlying dura and nerve roots, and generous foraminotomies were performed to adequately decompress the neural elements. Both the exiting and traversing nerve roots were decompressed on both sides until a coronary dilator passed easily along the nerve roots. Once the decompression was complete, I turned my attention to the posterior lower lumbar interbody fusion. The epidural venous vasculature was coagulated and cut sharply. Disc space  was incised and the initial discectomy was performed with pituitary rongeurs. The disc space was distracted with sequential distractors to a height of 10 mm. We then used a series of scrapers and shavers to prepare the endplates for fusion. The midline was prepared with Epstein curettes. Once the complete discectomy was finished, we packed an appropriate sized interbody cage with local autograft and morcellized allograft, gently retracted the nerve root, and tapped the cage into position at L4 5 and L5-S1.  The midline between the cages was packed with morselized autograft and allograft.   We then turned our attention to the placement of the lower pedicle screws. The pedicle screw entry zones were identified utilizing surface landmarks and fluoroscopy. I drilled into each pedicle utilizing the hand drill, and tapped each pedicle with the appropriate tap. We palpated with a ball probe to assure no break in the cortex. We then placed 5.5 x 40 mm pedicle screws into the pedicles bilaterally at L5 and 6.5 x 40 mm pedicle screws in the sacrum bilaterally.  My nurse practitioner assisted in placement of the pedicle screws.  We then decorticated the transverse processes and laid a mixture of morcellized autograft and allograft out over these to perform intertransverse arthrodesis at L4-5 and L5-S1. We then placed lordotic rods into the multiaxial screw heads of the pedicle screws and locked these in position with the locking caps and anti-torque device. We then checked our construct with AP and lateral fluoroscopy. Irrigated with copious amounts of 0.5% povidone iodine solution followed by saline solution. Inspected the nerve roots once again to assure adequate decompression, lined to the dura with Gelfoam,  and then we closed the muscle and the fascia with 0 Vicryl. Closed the subcutaneous tissues with 2-0 Vicryl and subcuticular tissues with 3-0 Vicryl. The skin was closed with benzoin and Steri-Strips. Dressing was  then applied, the patient was awakened from general anesthesia and transported to the recovery room in stable condition. At the end of the procedure all sponge, needle and instrument counts were correct.   PLAN OF CARE: admit to inpatient  PATIENT DISPOSITION:  PACU - hemodynamically stable.   Delay start of Pharmacological VTE agent (>24hrs) due to surgical blood loss or risk of bleeding:  yes

## 2023-05-31 NOTE — Progress Notes (Signed)
Orthopedic Tech Progress Note Patient Details:  Teresa Ford [birth year] [id number]  Ortho Devices Type of Ortho Device: Lumbar corsett Ortho Device/Splint Interventions: Ordered, Adjustment      [name] A [name] 05/31/2023, 2:50 PM

## 2023-05-31 NOTE — Transfer of Care (Signed)
Immediate Anesthesia Transfer of Care Note  Patient: AYVERIE FAHR  Procedure(s) Performed: Posterior Lumbar Interbody Fusion - Lumbar four-Lumbar five - Lumbar five-Sacral one - Posterior Lateral and Interbody fusion (Back)  Patient Location: PACU  Anesthesia Type:General  Level of Consciousness: sedated  Airway & Oxygen Therapy: Patient connected to face mask oxygen  Post-op Assessment: Report given to RN  Post vital signs: stable  Last Vitals:  Vitals Value Taken Time  BP 121/71 05/31/23 1143  Temp    Pulse 74 05/31/23 1147  Resp 15 05/31/23 1147  SpO2 100 % 05/31/23 1147  Vitals shown include unfiled device data.  Last Pain:  Vitals:   05/31/23 0630  PainSc: 0-No pain         Complications: No notable events documented.

## 2023-05-31 NOTE — Anesthesia Postprocedure Evaluation (Signed)
Anesthesia Post Note  Patient: Teresa Ford  Procedure(s) Performed: Posterior Lumbar Interbody Fusion - Lumbar four-Lumbar five - Lumbar five-Sacral one - Posterior Lateral and Interbody fusion (Back)     Patient location during evaluation: PACU Anesthesia Type: General Level of consciousness: awake and alert Pain management: pain level controlled Vital Signs Assessment: post-procedure vital signs reviewed and stable Respiratory status: spontaneous breathing, nonlabored ventilation and respiratory function stable Cardiovascular status: blood pressure returned to baseline and stable Postop Assessment: no apparent nausea or vomiting Anesthetic complications: no   No notable events documented.  Last Vitals:  Vitals:   05/31/23 1300 05/31/23 1331  BP: 97/69 115/61  Pulse: 68 63  Resp: 17 18  Temp: (!) 36.4 C 36.6 C  SpO2: 95% 94%    Last Pain:  Vitals:   05/31/23 1359  PainSc: 6                  Lowella Curb

## 2023-05-31 NOTE — Anesthesia Procedure Notes (Signed)
Procedure Name: Intubation Date/Time: 05/31/2023 7:45 AM  Performed by: Lowella Curb, MDPre-anesthesia Checklist: Patient identified, Emergency Drugs available, Suction available and Patient being monitored Patient Re-evaluated:Patient Re-evaluated prior to induction Oxygen Delivery Method: Circle system utilized Preoxygenation: Pre-oxygenation with 100% oxygen Induction Type: IV induction Ventilation: Mask ventilation without difficulty Laryngoscope Size: Mac and 3 Grade View: Grade I Tube type: Oral Tube size: 7.0 mm Number of attempts: 1 Airway Equipment and Method: Stylet and Oral airway Placement Confirmation: ETT inserted through vocal cords under direct vision, positive ETCO2 and breath sounds checked- equal and bilateral Tube secured with: Tape Dental Injury: Teeth and Oropharynx as per pre-operative assessment

## 2023-06-01 DIAGNOSIS — I4891 Unspecified atrial fibrillation: Secondary | ICD-10-CM | POA: Diagnosis not present

## 2023-06-01 DIAGNOSIS — J449 Chronic obstructive pulmonary disease, unspecified: Secondary | ICD-10-CM | POA: Diagnosis not present

## 2023-06-01 DIAGNOSIS — M4317 Spondylolisthesis, lumbosacral region: Secondary | ICD-10-CM | POA: Diagnosis not present

## 2023-06-01 DIAGNOSIS — I10 Essential (primary) hypertension: Secondary | ICD-10-CM | POA: Diagnosis not present

## 2023-06-01 DIAGNOSIS — Z8673 Personal history of transient ischemic attack (TIA), and cerebral infarction without residual deficits: Secondary | ICD-10-CM | POA: Diagnosis not present

## 2023-06-01 DIAGNOSIS — M4807 Spinal stenosis, lumbosacral region: Secondary | ICD-10-CM | POA: Diagnosis not present

## 2023-06-01 MED ORDER — METHOCARBAMOL 500 MG PO TABS: 500.0000 mg | ORAL_TABLET | Freq: Four times a day (QID) | ORAL | 0 refills | Status: AC | PRN

## 2023-06-01 MED ORDER — OXYCODONE-ACETAMINOPHEN 5-325 MG PO TABS: 1.0000 | ORAL_TABLET | Freq: Four times a day (QID) | ORAL | 0 refills | Status: DC | PRN

## 2023-06-01 NOTE — Evaluation (Signed)
Occupational Therapy Evaluation Patient Details Name: Teresa Ford MRN: [id number] DOB: May 11, 1946 Today's Date: 06/01/2023   History of Present Illness Pt is a 77 y/o female presenting on 9/6 for same day PLIF L4-5 L5-S1. PMH includes: anxiety, arthritis, afib, COPD, HTN, CVA (may 2024).   Clinical Impression   Patient admitted for above and presents with problem list below.  PTA pt was independent using cane PRN, light IADLs and just started back driving after CVA. Patient was educated on brace mgmt and wear schedule, back precautions, ADL compensatory techniques, AE/DME, mobility progression, safety and recommendations.  Today, pt demonstrated ability to complete bed mobility with supervision, transfers using RW with min guard fading to supervision, functional mobility using RW with min guard fading to supervision, and ADLs with supervision.  At discharge, pt will have support from daughter.  Based on performance today, no further needs required after dc home.  OT will follow acutely.        If plan is discharge home, recommend the following: A little help with walking and/or transfers;A little help with bathing/dressing/bathroom;Assistance with cooking/housework;Assist for transportation;Help with stairs or ramp for entrance    Functional Status Assessment  Patient has had a recent decline in their functional status and demonstrates the ability to make significant improvements in function in a reasonable and predictable amount of time.  Equipment Recommendations  Other (comment) (RW)    Recommendations for Other Services PT consult     Precautions / Restrictions Precautions Precautions: Fall;Back Precaution Booklet Issued: Yes (comment) Precaution Comments: reviewed with pt, good recall Required Braces or Orthoses: Spinal Brace Spinal Brace: Lumbar corset;Applied in sitting position Restrictions Weight Bearing Restrictions: No      Mobility Bed Mobility Overal bed  mobility: Needs Assistance Bed Mobility: Rolling, Sidelying to Sit, Sit to Sidelying Rolling: Supervision Sidelying to sit: Supervision     Sit to sidelying: Supervision General bed mobility comments: cueing for safety, technique    Transfers Overall transfer level: Needs assistance Equipment used: Rolling walker (2 wheels) Transfers: Sit to/from Stand Sit to Stand: Contact guard assist, Supervision           General transfer comment: min guard fading to supervision, cueing for hand placement and safety      Balance Overall balance assessment: Needs assistance Sitting-balance support: No upper extremity supported, Feet supported Sitting balance-Leahy Scale: Good     Standing balance support: Bilateral upper extremity supported, No upper extremity supported, During functional activity Standing balance-Leahy Scale: Fair Standing balance comment: relies on RW dynamically, able to engage in ADls statically with min gaurd to close supervision                           ADL either performed or assessed with clinical judgement   ADL Overall ADL's : Needs assistance/impaired     Grooming: Supervision/safety;Standing;Cueing for compensatory techniques           Upper Body Dressing : Set up;Sitting   Lower Body Dressing: Supervision/safety;Sit to/from stand;Adhering to back precautions;Cueing for compensatory techniques   Toilet Transfer: Supervision/safety;Ambulation;Rolling walker (2 wheels)   Toileting- Clothing Manipulation and Hygiene: Supervision/safety;Sit to/from stand;Adhering to back precautions   Tub/ Shower Transfer: Contact guard assist;Walk-in shower;Ambulation;Rolling walker (2 wheels);Shower Field seismologist Details (indicate cue type and reason): simulated Functional mobility during ADLs: Contact guard assist;Rolling walker (2 wheels) General ADL Comments: min guard fading to supervision, pt relies on RW dynamically     Vision  Vision Assessment?: No apparent visual deficits     Perception         Praxis         Pertinent Vitals/Pain Pain Assessment Pain Assessment: Faces Faces Pain Scale: Hurts a little bit Pain Location: back- incisional Pain Descriptors / Indicators: Discomfort, Operative site guarding Pain Intervention(s): Limited activity within patient's tolerance, Monitored during session, Repositioned     Extremity/Trunk Assessment Upper Extremity Assessment Upper Extremity Assessment: Overall WFL for tasks assessed   Lower Extremity Assessment Lower Extremity Assessment: Defer to PT evaluation   Cervical / Trunk Assessment Cervical / Trunk Assessment: Back Surgery   Communication Communication Communication: No apparent difficulties   Cognition Arousal: Alert Behavior During Therapy: WFL for tasks assessed/performed Overall Cognitive Status: Within Functional Limits for tasks assessed                                       General Comments       Exercises     Shoulder Instructions      Home Living Family/patient expects to be discharged to:: Private residence Living Arrangements: Children Available Help at Discharge: Family;Available 24 hours/day Type of Home: House Home Access: Ramped entrance     Home Layout: Able to live on main level with bedroom/bathroom;Two level Alternate Level Stairs-Number of Steps: flight   Bathroom Shower/Tub: Producer, television/film/video: Handicapped height     Home Equipment: Cane - single point;Rollator (4 wheels);Shower seat;Toilet riser          Prior Functioning/Environment Prior Level of Function : Independent/Modified Independent;Driving             Mobility Comments: using cane for mobility PRN ADLs Comments: independent ADLs, light IADLs, just started back driving        OT Problem List: Impaired balance (sitting and/or standing);Decreased activity tolerance;Decreased knowledge of use of DME or  AE;Decreased knowledge of precautions;Pain      OT Treatment/Interventions: Self-care/ADL training;Therapeutic exercise;DME and/or AE instruction;Patient/family education;Balance training;Therapeutic activities    OT Goals(Current goals can be found in the care plan section) Acute Rehab OT Goals Patient Stated Goal: home OT Goal Formulation: With patient Time For Goal Achievement: 06/15/23 Potential to Achieve Goals: Good  OT Frequency: Min 1X/week    Co-evaluation              AM-PAC OT "6 Clicks" Daily Activity     Outcome Measure Help from another person eating meals?: None Help from another person taking care of personal grooming?: A Little Help from another person toileting, which includes using toliet, bedpan, or urinal?: A Little Help from another person bathing (including washing, rinsing, drying)?: A Little Help from another person to put on and taking off regular upper body clothing?: A Little Help from another person to put on and taking off regular lower body clothing?: A Little 6 Click Score: 19   End of Session Equipment Utilized During Treatment: Rolling walker (2 wheels);Back brace Nurse Communication: Mobility status  Activity Tolerance: Patient tolerated treatment well Patient left: in bed;with call bell/phone within reach  OT Visit Diagnosis: Unsteadiness on feet (R26.81);Pain Pain - part of body:  (back)                Time: 1308-6578 OT Time Calculation (min): 23 min Charges:  OT General Charges $OT Visit: 1 Visit OT Evaluation $OT Eval Low Complexity: 1 Low  Jayel Scaduto B,  OT Acute Rehabilitation Services Office [phone]   Chancy Milroy 06/01/2023, 8:48 AM

## 2023-06-01 NOTE — Care Management (Signed)
Patient with order to DC to home today. Unit staff to provide DME needed for home.   No HH needs identified Patient will have family/ friends provide transportation home. No other TOC needs identified for DC 

## 2023-06-01 NOTE — Progress Notes (Signed)
Patient alert and oriented, ambulate, void, surgical site clean and dry no sign of infection. D/c instructions explain and given to the patient and daughter all questions answered. Patient d/c home with RW per order.

## 2023-06-01 NOTE — Discharge Summary (Signed)
Physician Discharge Summary  Patient ID: Teresa Ford MRN: [id number] DOB/AGE: Sep 30, 1945 77 y.o.  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 The patient tolerated the procedure well and was taken to the recovery room and then to the floor in stable condition. The hospital course was routine. There were no complications. The wound remained clean dry and intact. Pt had appropriate back soreness. No complaints of leg pain or new N/T/W. The patient remained afebrile with stable vital signs, and tolerated a regular diet. The patient continued to increase activities, and pain was well controlled with oral pain medications.   Consults: None  Significant Diagnostic Studies:  Results for orders placed or performed during the hospital encounter of 05/31/23  ABO/Rh  Result Value Ref Range   ABO/RH(D)      A POS Performed at [facility], [address] [place], [place] [address]     DG Lumbar Spine 2-3 Views  Result Date: 05/31/2023 CLINICAL DATA:  Elective surgery. EXAM: LUMBAR SPINE - 2-3 VIEW COMPARISON:  None Available. FINDINGS: Six fluoroscopic spot views of the lumbar spine obtained in the operating room. Sequential images during posterior rod with intrapedicular screw fusion L4 through S1 with interbody spacers. Fluoroscopy time 112.6 seconds. Dose 77.93 mGy. IMPRESSION: Intraoperative fluoroscopy during L4 through S1 posterior fusion. Electronically Signed   By: Narda Rutherford M.D.   On: 05/31/2023 12:23   DG C-Arm 1-60 Min-No Report  Result Date: 05/31/2023 Fluoroscopy was utilized by the requesting  physician.  No radiographic interpretation.   DG C-Arm 1-60 Min-No Report  Result Date: 05/31/2023 Fluoroscopy was utilized by the requesting physician.  No radiographic interpretation.   DG C-Arm 1-60 Min-No Report  Result Date: 05/31/2023 Fluoroscopy was utilized by the requesting physician.  No radiographic interpretation.   CT LUMBAR SPINE WO CONTRAST  Result Date: 05/22/2023 CLINICAL DATA:  Spondylolisthesis of lumbar region. Low back pain. Bilateral leg weakness. EXAM: CT LUMBAR SPINE WITHOUT CONTRAST TECHNIQUE: Multidetector CT imaging of the lumbar spine was performed without intravenous contrast administration. Multiplanar CT image reconstructions were also generated. RADIATION DOSE REDUCTION: This exam was performed according to the departmental dose-optimization program which includes automated exposure control, adjustment of the mA and/or kV according to patient size and/or use of iterative reconstruction technique. COMPARISON:  Radiography 03/05/2023.  MRI 216 2024. FINDINGS: Segmentation: 5 lumbar type vertebral bodies. Alignment: Degenerative anterolisthesis L4-5 measuring 11 mm. Spondylolisthesis at L5-S1 measuring 4 mm. Exaggerated lower lumbar lordosis. Vertebrae: Chronic bilateral pars defects at L5. No vertebral body fracture. Degenerative endplate changes at L4-5, more severe on the left. Paraspinal and other soft tissues: Negative Disc levels: No significant finding from T11-12 through L3-4. No stenosis or neural compression. L4-5: Chronic facet arthropathy with anterolisthesis of 11 mm. Chronic disc degeneration with loss of disc height and vacuum phenomenon, worse on the left. Sclerotic and cystic endplate changes worse on the left. Pseudo disc herniation. Severe stenosis of the canal and neural foramina at this level could cause neural compression on either or both sides. L5-S1: Chronic bilateral pars defects. Spondylolisthesis of 4 mm. Bulging of the disc. Mild stenosis of the  canal. Foraminal narrowing left worse than right.  Neural compression possible at this level. IMPRESSION: 1. Chronic bilateral pars defects at L5. Spondylolisthesis of 4 mm. Bulging of the disc. Mild stenosis of the canal. Foraminal narrowing left worse than right. Neural compression possible at this level. 2. Chronic facet arthropathy at L4-5 with anterolisthesis of 11 mm. Severe stenosis of the canal and neural foramina at this level could cause neural compression on either or both sides. Electronically Signed   By: Paulina Fusi M.D.   On: 05/22/2023 10:49    Antibiotics:  Anti-infectives (From admission, onward)    Start     Dose/Rate Route Frequency Ordered Stop   05/31/23 1600  ceFAZolin (ANCEF) IVPB 2g/100 mL premix        2 g 200 mL/hr over 30 Minutes Intravenous Every 8 hours 05/31/23 1308 06/01/23 0806   05/31/23 0616  ceFAZolin (ANCEF) 2-4 GM/100ML-% IVPB       Note to Pharmacy: Darrick Huntsman: cabinet override      05/31/23 0616 05/31/23 1829   05/31/23 0615  ceFAZolin (ANCEF) IVPB 2g/100 mL premix  Status:  Discontinued        2 g 200 mL/hr over 30 Minutes Intravenous On call to O.R. 05/31/23 9528 05/31/23 1328       Discharge Exam: Blood pressure (!) 100/52, pulse 75, temperature 98.4 F (36.9 C), temperature source Oral, resp. rate 19, height 5\' 2"  (1.575 m), weight 60.8 kg, last menstrual period 09/24/1974, SpO2 94%. Neurologic: Grossly normal Ambulating and voiding well incision cdi   Discharge Medications:   Allergies as of 06/01/2023       Reactions   Lamisil [terbinafine] Rash        Medication List     STOP taking these medications    apixaban 5 MG Tabs tablet Commonly known as: ELIQUIS       TAKE these medications    ascorbic acid 1000 MG tablet Commonly known as: VITAMIN C Take 1,000 mg by mouth daily.   bimatoprost 0.01 % Soln Commonly known as: LUMIGAN Place 1 drop into the right eye at bedtime.   buPROPion 150 MG 12 hr  tablet Commonly known as: Wellbutrin SR Take 1 tablet (150 mg total) by mouth daily for 3 days, THEN 1 tablet (150 mg total) 2 (two) times daily. Start taking on: February 26, 2023   CALCIUM 600/VITAMIN D PO Take 2 tablets by mouth daily.   CENTRUM SILVER PO Take 1 tablet by mouth daily.   Cequa 0.09 % Soln Generic drug: cycloSPORINE (PF) Place 1 drop into both eyes 2 (two) times daily.   diltiazem 120 MG 24 hr capsule Commonly known as: CARDIZEM CD Take 1 capsule (120 mg total) by mouth daily.   Incruse Ellipta 62.5 MCG/INH Aepb Generic drug: umeclidinium bromide Inhale 1 puff into the lungs daily.   loratadine 10 MG tablet Commonly known as: CLARITIN Take 10 mg by mouth daily as needed for allergies.   methocarbamol 500 MG tablet Commonly known as: ROBAXIN Take 1 tablet (500 mg total) by mouth every 6 (six) hours as needed for muscle spasms.   nicotine 21 mg/24hr patch Commonly known as: NICODERM CQ - dosed in mg/24 hours Place 1 patch (21 mg total) onto the skin daily.   oxyCODONE-acetaminophen 5-325 MG tablet Commonly known as: PERCOCET/ROXICET Take 1 tablet by mouth every 6 (six) hours as needed for severe pain.   rosuvastatin 40 MG tablet Commonly known as: CRESTOR Take 40 mg by mouth daily.   VITAMIN D PO Take 1  tablet by mouth daily.   vitamin E 180 MG (400 UNITS) capsule Take 400 Units by mouth daily.               Durable Medical Equipment  (From admission, onward)           Start     Ordered   05/31/23 1330  DME Walker rolling  Once       Question:  Patient needs a walker to treat with the following condition  Answer:  S/P lumbar fusion   05/31/23 1329   05/31/23 1330  DME 3 n 1  Once        05/31/23 1329            Disposition: home   Final Dx: PLIF L4-5, L5-S1  Discharge Instructions      Remove dressing in 72 hours   Complete by: As directed    Call MD for:  difficulty breathing, headache or visual disturbances   Complete  by: As directed    Call MD for:  hives   Complete by: As directed    Call MD for:  persistant dizziness or light-headedness   Complete by: As directed    Call MD for:  persistant nausea and vomiting   Complete by: As directed    Call MD for:  redness, tenderness, or signs of infection (pain, swelling, redness, odor or green/yellow discharge around incision site)   Complete by: As directed    Call MD for:  severe uncontrolled pain   Complete by: As directed    Call MD for:  temperature >100.4   Complete by: As directed    Diet - low sodium heart healthy   Complete by: As directed    Driving Restrictions   Complete by: As directed    No driving for 2 weeks, no riding in the car for 1 week   Increase activity slowly   Complete by: As directed    Lifting restrictions   Complete by: As directed    No lifting more than 8 lbs          Signed: Tiana Loft Kimia Finan 06/01/2023, 8:31 AM

## 2023-06-01 NOTE — Evaluation (Signed)
Physical Therapy Evaluation Patient Details Name: Teresa Ford MRN: [id number] DOB: 1946/06/16 Today's Date: 06/01/2023  History of Present Illness  Pt is a 77 y/o female presenting on 9/6 for same day PLIF L4-5 L5-S1. PMH includes: anxiety, arthritis, afib, COPD, HTN, CVA (may 2024).  Clinical Impression  Pt presents with mild post-op back pain, decreased application of spinal precautions but improved with continued session, impaired balance and activity tolerance. Pt ambulatory for good hallway distance with use of RW, at baseline uses cane but at this time needs support of RW . Pt's daughter will assist pt at d/c for the next 8 weeks, all education completed and questions answered.       If plan is discharge home, recommend the following: A little help with walking and/or transfers   Can travel by private vehicle        Equipment Recommendations Rolling walker (2 wheels)  Recommendations for Other Services       Functional Status Assessment Patient has had a recent decline in their functional status and demonstrates the ability to make significant improvements in function in a reasonable and predictable amount of time.     Precautions / Restrictions Precautions Precautions: Fall;Back Precaution Booklet Issued: Yes (comment) Precaution Comments: recalls 3/3 precautions Required Braces or Orthoses: Spinal Brace Spinal Brace: Lumbar corset;Applied in sitting position Restrictions Weight Bearing Restrictions: No      Mobility  Bed Mobility Overal bed mobility: Needs Assistance Bed Mobility: Rolling, Sidelying to Sit, Sit to Sidelying Rolling: Supervision Sidelying to sit: Supervision     Sit to sidelying: Supervision General bed mobility comments: cues for log roll technique    Transfers Overall transfer level: Needs assistance Equipment used: Rolling walker (2 wheels) Transfers: Sit to/from Stand Sit to Stand: Supervision           General transfer  comment: safety, hand placement cues    Ambulation/Gait Ambulation/Gait assistance: Supervision Gait Distance (Feet): 300 Feet Assistive device: Rolling walker (2 wheels) Gait Pattern/deviations: Step-through pattern, Decreased stride length Gait velocity: decr     General Gait Details: cues for upright posture, proximity to [employer]            Wheelchair Mobility     Tilt Bed    Modified Rankin (Stroke Patients Only)       Balance Overall balance assessment: Needs assistance Sitting-balance support: No upper extremity supported, Feet supported Sitting balance-Leahy Scale: Good     Standing balance support: Bilateral upper extremity supported, No upper extremity supported, During functional activity Standing balance-Leahy Scale: Fair Standing balance comment: relies on RW dynamically                             Pertinent Vitals/Pain Pain Assessment Pain Assessment: Faces Faces Pain Scale: Hurts a little bit Pain Location: back- incisional Pain Descriptors / Indicators: Discomfort, Operative site guarding Pain Intervention(s): Limited activity within patient's tolerance, Monitored during session, Repositioned    Home Living Family/patient expects to be discharged to:: Private residence Living Arrangements: Children Available Help at Discharge: Family;Available 24 hours/day Type of Home: House Home Access: Ramped entrance     Alternate Level Stairs-Number of Steps: flight Home Layout: Able to live on main level with bedroom/bathroom;Two level Home Equipment: Cane - single point;Rollator (4 wheels);Shower seat;Toilet riser      Prior Function Prior Level of Function : Independent/Modified Independent;Driving             Mobility  Comments: using cane for mobility PRN ADLs Comments: independent ADLs, light IADLs, just started back driving     Extremity/Trunk Assessment   Upper Extremity Assessment Upper Extremity Assessment: Defer  to OT evaluation    Lower Extremity Assessment Lower Extremity Assessment: Generalized weakness    Cervical / Trunk Assessment Cervical / Trunk Assessment: Back Surgery  Communication   Communication Communication: No apparent difficulties  Cognition Arousal: Alert Behavior During Therapy: WFL for tasks assessed/performed Overall Cognitive Status: Within Functional Limits for tasks assessed                                          General Comments      Exercises   Home walking program: up and walking 1x/hour during waking hours for short household distances with supervision of family, to promote circulation, activity tolerance, and strength maintenance.     Assessment/Plan    PT Assessment All further PT needs can be met in the next venue of care  PT Problem List Decreased strength;Decreased mobility;Decreased activity tolerance;Decreased balance;Decreased knowledge of use of DME;Pain;Decreased knowledge of precautions       PT Treatment Interventions DME instruction;Therapeutic activities;Gait training;Therapeutic exercise;Patient/family education;Balance training;Stair training;Functional mobility training;Neuromuscular re-education    PT Goals (Current goals can be found in the Care Plan section)  Acute Rehab PT Goals PT Goal Formulation: With patient Time For Goal Achievement: 06/01/23 Potential to Achieve Goals: Good    Frequency Min 5X/week     Co-evaluation               AM-PAC PT "6 Clicks" Mobility  Outcome Measure Help needed turning from your back to your side while in a flat bed without using bedrails?: None Help needed moving from lying on your back to sitting on the side of a flat bed without using bedrails?: None Help needed moving to and from a bed to a chair (including a wheelchair)?: A Little Help needed standing up from a chair using your arms (e.g., wheelchair or bedside chair)?: A Little Help needed to walk in hospital  room?: A Little Help needed climbing 3-5 steps with a railing? : A Little 6 Click Score: 20    End of Session Equipment Utilized During Treatment: Back brace Activity Tolerance: Patient tolerated treatment well;Patient limited by fatigue Patient left: in bed;with call bell/phone within reach Nurse Communication: Mobility status PT Visit Diagnosis: Other abnormalities of gait and mobility (R26.89);Muscle weakness (generalized) (M62.81)    Time: 0109-3235 PT Time Calculation (min) (ACUTE ONLY): 17 min   Charges:   PT Evaluation $PT Eval Low Complexity: 1 Low   PT General Charges $$ ACUTE PT VISIT: 1 Visit         Marye Round, PT DPT Acute Rehabilitation Services Secure Chat Preferred  Office [phone]   Kharson Rasmusson E Stroup 06/01/2023, 11:02 AM

## 2023-06-05 ENCOUNTER — Ambulatory Visit (HOSPITAL_BASED_OUTPATIENT_CLINIC_OR_DEPARTMENT_OTHER): Payer: Medicare Other | Admitting: Cardiology

## 2023-07-11 DIAGNOSIS — M4316 Spondylolisthesis, lumbar region: Secondary | ICD-10-CM | POA: Diagnosis not present

## 2023-07-23 ENCOUNTER — Inpatient Hospital Stay: Admission: RE | Admit: 2023-07-23 | Payer: Medicare Other | Source: Ambulatory Visit

## 2023-08-02 ENCOUNTER — Ambulatory Visit (HOSPITAL_BASED_OUTPATIENT_CLINIC_OR_DEPARTMENT_OTHER): Payer: Medicare Other | Admitting: Family

## 2023-08-02 ENCOUNTER — Encounter (HOSPITAL_BASED_OUTPATIENT_CLINIC_OR_DEPARTMENT_OTHER): Payer: Self-pay | Admitting: Family

## 2023-08-02 VITALS — BP 118/72 | HR 70 | Ht 62.0 in | Wt 133.5 lb

## 2023-08-02 DIAGNOSIS — Z8673 Personal history of transient ischemic attack (TIA), and cerebral infarction without residual deficits: Secondary | ICD-10-CM | POA: Diagnosis not present

## 2023-08-02 DIAGNOSIS — I480 Paroxysmal atrial fibrillation: Secondary | ICD-10-CM | POA: Diagnosis not present

## 2023-08-02 DIAGNOSIS — D6859 Other primary thrombophilia: Secondary | ICD-10-CM | POA: Diagnosis not present

## 2023-08-02 DIAGNOSIS — I10 Essential (primary) hypertension: Secondary | ICD-10-CM

## 2023-08-02 MED ORDER — DILTIAZEM HCL ER COATED BEADS 120 MG PO CP24: 120.0000 mg | ORAL_CAPSULE | Freq: Every day | ORAL | 3 refills | Status: AC

## 2023-08-02 MED ORDER — ROSUVASTATIN CALCIUM 40 MG PO TABS: 40.0000 mg | ORAL_TABLET | Freq: Every day | ORAL | 3 refills | Status: AC

## 2023-08-02 MED ORDER — APIXABAN 5 MG PO TABS
5.0000 mg | ORAL_TABLET | Freq: Two times a day (BID) | ORAL | 5 refills | Status: AC
Start: 2023-08-02 — End: ?

## 2023-08-02 NOTE — Progress Notes (Signed)
Cardiology Office Note:  .   Date:  08/02/2023  ID:  Teresa Ford, DOB 28-Oct-1945, MRN [id number] PCP: Darrow Bussing, MD  [facility] Providers Cardiologist:  Jodelle Red, MD    History of Present Illness: .   Teresa Ford is a 77 y.o. female  with a hx of CVA, atrial fibrillation, hyperlipidemia, coronary calcification on CT, diabetes, hypertension, COPD, GAD, depression.   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  Presumed due to new onset atrial fibrillation with RVR.  Echocardiogram LVEF 60 to 65%.  Bilateral lower extremity with no DVT.  Discharged on Eliquis 5 mg BID, Diltiazem 120mg  QD.  Losartan and hydrochlorothiazide were held to prevent hypotension as diltiazem initiated. Seen in follow up and Wellbutrin added for smoking cessation. 05/31/23 underwent posterior lumbar interbody fusion.    Presents today for follow up. Feeling overall well since last seen. Moving to [place] to be closer to her children. Has quit smoking as of 01/2023. Still vaping occasionally.Reports no shortness of breath nor dyspnea on exertion. Reports no chest pain, pressure, or tightness. No edema, orthopnea, PND. Reports no palpitations.     ROS: Please see the history of present illness.    All other systems reviewed and are negative.   Studies Reviewed: .        Cardiac Studies & Procedures     STRESS TESTS  MYOCARDIAL PERFUSION IMAGING 10/25/2015  Narrative  Nuclear stress EF: 77%. Apical septal akinesis  There was no ST segment deviation noted during stress.  Defect 1: There is a medium defect of severe severity present in the apical anterior and apex location.  Findings consistent with prior myocardial infarction.  This is a low risk study.   ECHOCARDIOGRAM  ECHOCARDIOGRAM COMPLETE 02/05/2023  Narrative ECHOCARDIOGRAM REPORT    Patient Name:   Teresa Ford Date of Exam:  02/05/2023 Medical Rec #:  [id number]          Height:       62.5 in Accession #:    [id number]         Weight:       141.5 lb Date of Birth:  17-Apr-1946          BSA:          1.660 m Patient Age:    77 years           BP:           122/84 mmHg Patient Gender: F                  HR:           66 bpm. Exam Location:  Inpatient  Procedure: 2D Echo, Cardiac Doppler and Color Doppler  Indications:    Stroke I63.9  History:        Patient has no prior history of Echocardiogram examinations. Stroke; Risk Factors:Dyslipidemia and Current Smoker.  Sonographer:    Lucendia Herrlich Referring Phys: [phone] ERIC LINDZEN  IMPRESSIONS   1. Left ventricular ejection fraction, by estimation, is 60 to 65%. The left ventricle has normal function. The left ventricle has no regional wall motion abnormalities. Left ventricular diastolic parameters are indeterminate. 2. Right ventricular systolic function is normal. The right ventricular size is normal. 3. The mitral valve is degenerative. Mild mitral valve regurgitation. No evidence of mitral stenosis. 4. The aortic valve is normal in structure. Aortic valve regurgitation is  not visualized. No aortic stenosis is present. 5. The inferior vena cava is normal in size with greater than 50% respiratory variability, suggesting right atrial pressure of 3 mmHg.  FINDINGS Left Ventricle: Left ventricular ejection fraction, by estimation, is 60 to 65%. The left ventricle has normal function. The left ventricle has no regional wall motion abnormalities. The left ventricular internal cavity size was normal in size. There is no left ventricular hypertrophy. Left ventricular diastolic parameters are indeterminate.  Right Ventricle: The right ventricular size is normal. No increase in right ventricular wall thickness. Right ventricular systolic function is normal.  Left Atrium: Left atrial size was normal in size.  Right Atrium: Right atrial size was normal in  size.  Pericardium: There is no evidence of pericardial effusion. Presence of epicardial fat layer.  Mitral Valve: The mitral valve is degenerative in appearance. Mild to moderate mitral annular calcification. Mild mitral valve regurgitation. No evidence of mitral valve stenosis.  Tricuspid Valve: The tricuspid valve is normal in structure. Tricuspid valve regurgitation is not demonstrated. No evidence of tricuspid stenosis.  Aortic Valve: The aortic valve is normal in structure. Aortic valve regurgitation is not visualized. No aortic stenosis is present. Aortic valve peak gradient measures 11.3 mmHg.  Pulmonic Valve: The pulmonic valve was normal in structure. Pulmonic valve regurgitation is not visualized. No evidence of pulmonic stenosis.  Aorta: The aortic root is normal in size and structure.  Venous: The inferior vena cava is normal in size with greater than 50% respiratory variability, suggesting right atrial pressure of 3 mmHg.  IAS/Shunts: No atrial level shunt detected by color flow Doppler.   LEFT VENTRICLE PLAX 2D LVIDd:         4.20 cm   Diastology LVIDs:         2.80 cm   LV e' medial:    9.48 cm/s LV PW:         0.90 cm   LV E/e' medial:  11.9 LV IVS:        0.70 cm   LV e' lateral:   10.60 cm/s LVOT diam:     2.00 cm   LV E/e' lateral: 10.7 LV SV:         69 LV SV Index:   42 LVOT Area:     3.14 cm   RIGHT VENTRICLE             IVC RV S prime:     13.50 cm/s  IVC diam: 2.00 cm TAPSE (M-mode): 2.4 cm  LEFT ATRIUM           Index        RIGHT ATRIUM           Index LA diam:      3.70 cm 2.23 cm/m   RA Area:     15.45 cm LA Vol (A4C): 38.6 ml 23.22 ml/m  RA Volume:   37.90 ml  22.83 ml/m AORTIC VALVE AV Area (Vmax): 1.93 cm AV Vmax:        168.00 cm/s AV Peak Grad:   11.3 mmHg LVOT Vmax:      103.47 cm/s LVOT Vmean:     67.267 cm/s LVOT VTI:       0.219 m  AORTA Ao Asc diam: 2.67 cm  MITRAL VALVE                TRICUSPID VALVE MV Area (PHT): 5.13  cm     TR Peak grad:   30.5 mmHg  MV Decel Time: 148 msec     TR Vmax:        276.00 cm/s MV E velocity: 113.00 cm/s MV A velocity: 73.30 cm/s   SHUNTS MV E/A ratio:  1.54         Systemic VTI:  0.22 m Systemic Diam: 2.00 cm  Kardie Tobb DO Electronically signed by Thomasene Ripple DO Signature Date/Time: 02/05/2023/1:23:43 PM    Final             Risk Assessment/Calculations:    CHA2DS2-VASc Score = 7   This indicates a 11.2% annual risk of stroke. The patient's score is based upon: CHF History: 0 HTN History: 1 Diabetes History: 0 Stroke History: 2 Vascular Disease History: 1 Age Score: 2 Gender Score: 1            Physical Exam:   VS:  BP 118/72   Pulse 70   Ht 5\' 2"  (1.575 m)   Wt 133 lb 8 oz (60.6 kg)   LMP 09/24/1974   SpO2 91%   BMI 24.42 kg/m    Wt Readings from Last 3 Encounters:  08/02/23 133 lb 8 oz (60.6 kg)  05/31/23 134 lb (60.8 kg)  05/23/23 134 lb 6.4 oz (61 kg)    GEN: Well nourished, well developed in no acute distress NECK: No JVD; No carotid bruits CARDIAC: RRR, no murmurs, rubs, gallops RESPIRATORY:  Clear to auscultation without rales, wheezing or rhonchi  ABDOMEN: Soft, non-tender, non-distended EXTREMITIES:  No edema; No deformity   ASSESSMENT AND PLAN: .    PAF / Hypercoagulable state - Maintaining NSR by auscultation today. Continue Diltiazem 120mg  QD, Eliquis 5mg  BID. Does not meet dose reduction criteria. Labs 02/15/23 with no anemia - denies bleeding complications. CHA2DS2-VASc Score = 8 [CHF History: 0, HTN History: 1, Diabetes History: 1, Stroke History: 2, Vascular Disease History: 1, Age Score: 2, Gender Score: 1].  Therefore, the patient's annual risk of stroke is 10.8 %.      Hx of CVA - Continue Rosuvastatin. No ASA due to [facility].    Tobacco use - Congratulated on cessation.            Dispo: follow up in [place], [place] after move  Signed, Alver Sorrow, NP

## 2023-08-02 NOTE — Patient Instructions (Signed)
Medication Instructions:  Continue your current medications.  *If you need a refill on your cardiac medications before your next appointment, please call your pharmacy*   Follow-Up: At [facility], you and your health needs are our priority.  As part of our continuing mission to provide you with exceptional heart care, we have created designated Provider Care Teams.  These Care Teams include your primary Cardiologist (physician) and Advanced Practice Providers (APPs -  Physician Assistants and Nurse Practitioners) who all work together to provide you with the care you need, when you need it.  We recommend signing up for the patient portal called "MyChart".  Sign up information is provided on this After Visit Summary.  MyChart is used to connect with patients for Virtual Visits (Telemedicine).  Patients are able to view lab/test results, encounter notes, upcoming appointments, etc.  Non-urgent messages can be sent to your provider as well.   To learn more about what you can do with MyChart, go to [email or web address].    Your next appointment:   Recommend establishing with a cardiologist after moving to [place], [place]

## 2023-08-15 DIAGNOSIS — I10 Essential (primary) hypertension: Secondary | ICD-10-CM | POA: Diagnosis not present

## 2023-08-15 DIAGNOSIS — E782 Mixed hyperlipidemia: Secondary | ICD-10-CM | POA: Diagnosis not present

## 2023-08-15 DIAGNOSIS — M51362 Other intervertebral disc degeneration, lumbar region with discogenic back pain and lower extremity pain: Secondary | ICD-10-CM | POA: Diagnosis not present

## 2023-08-15 DIAGNOSIS — I480 Paroxysmal atrial fibrillation: Secondary | ICD-10-CM | POA: Diagnosis not present

## 2023-08-29 DIAGNOSIS — N3941 Urge incontinence: Secondary | ICD-10-CM | POA: Diagnosis not present

## 2023-08-29 DIAGNOSIS — Z23 Encounter for immunization: Secondary | ICD-10-CM | POA: Diagnosis not present

## 2023-08-29 DIAGNOSIS — Z122 Encounter for screening for malignant neoplasm of respiratory organs: Secondary | ICD-10-CM | POA: Diagnosis not present

## 2023-08-29 DIAGNOSIS — Z1331 Encounter for screening for depression: Secondary | ICD-10-CM | POA: Diagnosis not present

## 2023-08-29 DIAGNOSIS — Z7189 Other specified counseling: Secondary | ICD-10-CM | POA: Diagnosis not present

## 2023-08-29 DIAGNOSIS — I480 Paroxysmal atrial fibrillation: Secondary | ICD-10-CM | POA: Diagnosis not present

## 2023-08-29 DIAGNOSIS — Z9181 History of falling: Secondary | ICD-10-CM | POA: Diagnosis not present

## 2023-08-29 DIAGNOSIS — I10 Essential (primary) hypertension: Secondary | ICD-10-CM | POA: Diagnosis not present

## 2023-08-29 DIAGNOSIS — Z1339 Encounter for screening examination for other mental health and behavioral disorders: Secondary | ICD-10-CM | POA: Diagnosis not present

## 2023-09-02 DIAGNOSIS — I480 Paroxysmal atrial fibrillation: Secondary | ICD-10-CM | POA: Diagnosis not present

## 2023-09-02 DIAGNOSIS — I6523 Occlusion and stenosis of bilateral carotid arteries: Secondary | ICD-10-CM | POA: Diagnosis not present

## 2023-09-02 DIAGNOSIS — E7849 Other hyperlipidemia: Secondary | ICD-10-CM | POA: Diagnosis not present

## 2023-09-02 DIAGNOSIS — I679 Cerebrovascular disease, unspecified: Secondary | ICD-10-CM | POA: Diagnosis not present

## 2023-09-02 DIAGNOSIS — I10 Essential (primary) hypertension: Secondary | ICD-10-CM | POA: Diagnosis not present

## 2023-09-02 DIAGNOSIS — J449 Chronic obstructive pulmonary disease, unspecified: Secondary | ICD-10-CM | POA: Diagnosis not present

## 2024-02-04 ENCOUNTER — Encounter: Payer: Self-pay | Admitting: *Deleted

## 2024-06-06 ENCOUNTER — Other Ambulatory Visit (HOSPITAL_BASED_OUTPATIENT_CLINIC_OR_DEPARTMENT_OTHER): Payer: Self-pay | Admitting: Family

## 2024-06-06 DIAGNOSIS — Z8673 Personal history of transient ischemic attack (TIA), and cerebral infarction without residual deficits: Secondary | ICD-10-CM
